# Patient Record
Sex: Female | Born: 1940 | Race: White | Hispanic: No | Marital: Married | Smoking: Never smoker
Health system: Southern US, Community
[De-identification: ages and names within clinical notes are randomized; demographics above are authoritative.]

## PROBLEM LIST (undated history)

## (undated) DIAGNOSIS — M1990 Unspecified osteoarthritis, unspecified site: Secondary | ICD-10-CM

## (undated) DIAGNOSIS — E079 Disorder of thyroid, unspecified: Secondary | ICD-10-CM

## (undated) DIAGNOSIS — F32A Depression, unspecified: Secondary | ICD-10-CM

## (undated) DIAGNOSIS — M8580 Other specified disorders of bone density and structure, unspecified site: Secondary | ICD-10-CM

## (undated) DIAGNOSIS — F329 Major depressive disorder, single episode, unspecified: Secondary | ICD-10-CM

## (undated) DIAGNOSIS — T7840XA Allergy, unspecified, initial encounter: Secondary | ICD-10-CM

## (undated) DIAGNOSIS — G43909 Migraine, unspecified, not intractable, without status migrainosus: Secondary | ICD-10-CM

## (undated) DIAGNOSIS — F0390 Unspecified dementia without behavioral disturbance: Secondary | ICD-10-CM

## (undated) DIAGNOSIS — E785 Hyperlipidemia, unspecified: Secondary | ICD-10-CM

## (undated) HISTORY — DX: Disorder of thyroid, unspecified: E07.9

## (undated) HISTORY — DX: Unspecified osteoarthritis, unspecified site: M19.90

## (undated) HISTORY — DX: Depression, unspecified: F32.A

## (undated) HISTORY — DX: Allergy, unspecified, initial encounter: T78.40XA

## (undated) HISTORY — DX: Migraine, unspecified, not intractable, without status migrainosus: G43.909

## (undated) HISTORY — DX: Major depressive disorder, single episode, unspecified: F32.9

## (undated) HISTORY — DX: Hyperlipidemia, unspecified: E78.5

## (undated) HISTORY — DX: Other specified disorders of bone density and structure, unspecified site: M85.80

## (undated) HISTORY — PX: BREAST SURGERY: SHX581

## (undated) HISTORY — DX: Unspecified dementia, unspecified severity, without behavioral disturbance, psychotic disturbance, mood disturbance, and anxiety: F03.90

---

## 1993-12-03 HISTORY — PX: COLON SURGERY: SHX602

## 2000-02-01 ENCOUNTER — Other Ambulatory Visit: Admission: RE | Admit: 2000-02-01 | Discharge: 2000-02-01 | Payer: Self-pay | Admitting: Gynecology

## 2000-02-19 ENCOUNTER — Ambulatory Visit (HOSPITAL_COMMUNITY): Admission: RE | Admit: 2000-02-19 | Discharge: 2000-02-19 | Payer: Self-pay | Admitting: Gastroenterology

## 2001-02-03 ENCOUNTER — Other Ambulatory Visit: Admission: RE | Admit: 2001-02-03 | Discharge: 2001-02-03 | Payer: Self-pay | Admitting: Gynecology

## 2002-01-05 ENCOUNTER — Other Ambulatory Visit: Admission: RE | Admit: 2002-01-05 | Discharge: 2002-01-05 | Payer: Self-pay | Admitting: Gynecology

## 2002-03-25 ENCOUNTER — Other Ambulatory Visit: Admission: RE | Admit: 2002-03-25 | Discharge: 2002-03-25 | Payer: Self-pay | Admitting: General Surgery

## 2002-04-03 ENCOUNTER — Ambulatory Visit (HOSPITAL_COMMUNITY): Admission: RE | Admit: 2002-04-03 | Discharge: 2002-04-03 | Payer: Self-pay | Admitting: General Surgery

## 2002-04-03 ENCOUNTER — Encounter (INDEPENDENT_AMBULATORY_CARE_PROVIDER_SITE_OTHER): Payer: Self-pay | Admitting: *Deleted

## 2003-02-16 ENCOUNTER — Other Ambulatory Visit: Admission: RE | Admit: 2003-02-16 | Discharge: 2003-02-16 | Payer: Self-pay | Admitting: Gynecology

## 2003-10-20 ENCOUNTER — Encounter: Admission: RE | Admit: 2003-10-20 | Discharge: 2003-10-20 | Payer: Self-pay | Admitting: Family Medicine

## 2004-03-14 ENCOUNTER — Other Ambulatory Visit: Admission: RE | Admit: 2004-03-14 | Discharge: 2004-03-14 | Payer: Self-pay | Admitting: Gynecology

## 2005-03-05 ENCOUNTER — Other Ambulatory Visit: Admission: RE | Admit: 2005-03-05 | Discharge: 2005-03-05 | Payer: Self-pay | Admitting: Gynecology

## 2005-05-10 ENCOUNTER — Inpatient Hospital Stay (HOSPITAL_COMMUNITY): Admission: EM | Admit: 2005-05-10 | Discharge: 2005-05-12 | Payer: Self-pay | Admitting: *Deleted

## 2005-05-11 ENCOUNTER — Encounter: Payer: Self-pay | Admitting: Cardiovascular Disease

## 2005-05-11 ENCOUNTER — Ambulatory Visit: Payer: Self-pay | Admitting: Cardiovascular Disease

## 2005-07-02 ENCOUNTER — Encounter: Admission: RE | Admit: 2005-07-02 | Discharge: 2005-09-30 | Payer: Self-pay | Admitting: Family Medicine

## 2006-03-11 ENCOUNTER — Other Ambulatory Visit: Admission: RE | Admit: 2006-03-11 | Discharge: 2006-03-11 | Payer: Self-pay | Admitting: Gynecology

## 2007-04-21 ENCOUNTER — Other Ambulatory Visit: Admission: RE | Admit: 2007-04-21 | Discharge: 2007-04-21 | Payer: Self-pay | Admitting: Gynecology

## 2007-12-22 LAB — HM COLONOSCOPY: HM Colonoscopy: NORMAL

## 2008-05-07 ENCOUNTER — Other Ambulatory Visit: Admission: RE | Admit: 2008-05-07 | Discharge: 2008-05-07 | Payer: Self-pay | Admitting: Gynecology

## 2009-10-04 ENCOUNTER — Encounter: Admission: RE | Admit: 2009-10-04 | Discharge: 2009-10-04 | Payer: Self-pay | Admitting: Gastroenterology

## 2010-12-24 ENCOUNTER — Encounter: Payer: Self-pay | Admitting: Neurology

## 2011-02-13 LAB — HM MAMMOGRAPHY

## 2011-02-22 LAB — HM DEXA SCAN: HM Dexa Scan: BORDERLINE

## 2011-04-20 NOTE — Discharge Summary (Signed)
NAME:  Sophia Wright, Sophia Wright                 ACCOUNT NO.:  [id number]   MEDICAL RECORD NO.:  [id number]          PATIENT TYPE:  INP   LOCATION:  4706                         FACILITY:  [facility]   PHYSICIAN:  Deirdre Peer. Polite, M.D. DATE OF BIRTH:  Jul 08, 1941   DATE OF ADMISSION:  05/10/2005  DATE OF DISCHARGE:  05/12/2005                                 DISCHARGE SUMMARY   DISCHARGE DIAGNOSES:  1.  Syncope probably on a basis of vasovagal.  2.  History of migraines with increasing frequency on an outpatient basis      resolved with current outpatient medications and further outpatient      follow-up with primary doctor.  3.  History of vaginal spotting seen on an outpatient basis by gynecologist.      Hemoglobin and hematocrit stable during this hospitalization. Recommend      continued follow up as outlined by gynecologist.  4.  History of benign positional vertigo.  5.  Anxiety/depression.  6.  Hypothyroidism. Please note, thyroid-stimulating hormones within normal      limits at 1.1.  7.  History of cholelithiasis approximately 11 years ago, status post      chemotherapy.   DISCHARGE MEDICATIONS:  The patient is to resume home medications which  include Lexapro, Synthroid, p.r.n. Zomig, p.r.n. Xanax.   DISPOSITION:  The patient is discharged to home in stable condition. Asked  to follow up with primary doctor on Monday for further outpatient follow-up.   LABORATORY DATA:  The patient had orthostatic vitals which were within  normal limits. EKG within normal limits. Spiral CT to rule out PE within  normal limits. No PE. 2-D echocardiogram with normal EF, no wall motion  abnormality. Carotid Dopplers showed no evidence of stenosis. A CT of the  brain showed no acute intracranial abnormalities. Small focus of  hypoattenuation in the left frontal deep white mater. MRI and MRA showed  mild atrophy with small vessel disease. No focal acute intracranial  abnormality which might explain the  patient's syncope. MRA negative  angiography of intracranial circulation. There are signs of chronic  sinusitis effecting mainly the ethmoid's.   CBC within normal limits. D-dimer 0.071. BMET within normal limits.   HISTORY OF PRESENT ILLNESS:  Ms. Gudgel is a 70 year old female with the  above-medical problems who presented to the ED for evaluation and syncope  spell. Per the patient's recollection, the patient was doing fine that day  without any problems. While at a grocery store standing in line, she stated  that she began to feel weak, woozy, as if she was going to fall out. The  patient subsequently had a syncope spell. Upon awakening, the patient  gradually began to feel better and initially did not want to go to the ED;  however, she was encouraged by bystanders and then went to the ED. In the  ED, the patient was evaluated and admission was deemed necessary for further  evaluation and treatment of syncope. Please see dictated H&P for further  details.   PAST MEDICAL HISTORY:  As stated above.   MEDICATIONS:  Per  admission H&P.   SOCIAL HISTORY:  Negative for tobacco, alcohol, and drug use.   HOSPITAL COURSE:  The patient was admitted to telemetry floor bed for  evaluation of syncope. As stated, the patient was in her usual state of  health until the day of admission when she had a syncopal spell. The patient  described feeling weak and lightheaded. Before her syncopal spell, she  denied any chest pain or shortness of breath. No nausea or vomiting. Did not  have any precluding migraine either, although the patient did state that she  has been having some increased frequency of her migraines recently. The  patient denied any seizure-like activity and has no history of the same. The  patient denies any new medication use. Denies having prior hypoglycemic  events. Denies having any prior syncopal events. Only thing that is somewhat  new is the patient states she has been on  Climara but she stopped it nine  days ago after having some vaginal spotting which has been and is currently  being evaluated by her OB/GYN.   During this hospitalization, the patient had several studies. The patient  was placed on a monitor. The patient had rare PVCs. As a matter of fact, the  patient had a 2-D echocardiogram that was within normal limits. Outpatient  records were checked and recently had a stress test which was within normal  limits. The patient had a MRI without acute abnormality and MRA without  acute abnormality. During this hospitalization, carotid Dopplers were done  which were within normal limits as well. All electrolytes and thyroid  studies were within normal limits. The patient occasionally had some trouble  with her migraine headache which was resolved with her p.r.n. medications.   On the second day of admission, the patient was able to ambulate in the hall  without any difficulty. No complaints of syncope. No arrhythmia. At this  time, the patient was felt stable for discharge to home. The current  diagnosis as the cause for her syncope is now vasovagal. We will recommend  the patient to be discharged to home. Have the patient follow up with  primary doctor. If the patient has further events, recommend further  evaluation at that time.   At this time, the patient is felt to be stable for discharge to home.       RDP/MEDQ  D:  05/12/2005  T:  05/12/2005  Job:  [id number]

## 2011-04-20 NOTE — H&P (Signed)
NAME:  Sophia Wright, Sophia Wright NO.:  [id number]   MEDICAL RECORD NO.:  [id number]          PATIENT TYPE:  EMS   LOCATION:  MAJO                         FACILITY:  [facility]   PHYSICIAN:  Hollice Espy, M.D.DATE OF BIRTH:  1941-03-17   DATE OF ADMISSION:  05/10/2005  DATE OF DISCHARGE:                                HISTORY & PHYSICAL   ATTENDING PHYSICIAN:  Deirdre Peer. Polite, M.D.   CHIEF COMPLAINT:  Syncope.   The patient is a 70 year old white female with past medical history of  hypothyroidism and depression who presents with near syncopal episode. She  tells me that she has been doing relatively well with no complaints other  than she has had a ten-year history of migraines and in the last few months  has been having worsening episodes. These are usually relieved with Zomig,  but she tells me that she has been under a lot of stress due to a lot of  social commitments. She had never had a syncopal episode prior to this, but  approximately 8 or 9 years ago suffered from benign positional vertigo.  Today, she said she felt in her normal state of health and was in the  grocery store talking to a friend when all of a sudden she said she began to  feel extremely lightheaded and dizzy.  She felt as if she was going to lose  her balance and felt as if things started to spin around and all of a sudden  the next thing she new she woke up and she was on the ground. Reportedly she  had passed out, fallen, striking the shelves at the grocery store, and then  falling backwards hitting the head of her head on the concrete floor. When  she woke up people were staring over her and paramedics were called. The  patient initially did not want to go to the hospital, preferred to home with  her husband, but when parents arrived she was convince to go to the  emergency room. In the emergency room, the patient underwent a CT scan of  the head which showed a small focus of hypoattenuation in  the left frontal  deep white matter compatible with microvascular ischemic change. He said he  was otherwise negative. The patient had no evidence of any subdural bleed or  any other area reported of infarct. The rest of her labs were sent off for  further evaluation including I-STAT labs which were essentially  unremarkable. EKG showed a complete normal sinus rhythm. Currently, the  patient is fully alert and oriented. She complains of some tingling in the  back of her head, but denies any vision changes or dysphagia. When I sat her  up she denied any spinning, but she felt slightly lightheaded when standing  up. She denies any dysphagia, chest pain, palpitations, shortness of breath,  wheezing, coughing, abdominal pain, hematuria, dysuria, constipation,  diarrhea, focal extremity numbness, or weakness. She does, however, tell me  she has been feeling weaker, her skin has been more dry, and she has been  wondering lately if  her thyroid is not properly dosed.   Her review of systems is otherwise negative.   PAST MEDICAL HISTORY:  1.  Colon cancer approximately 11 years ago. She is status post chemotherapy      treatment for this.  2.  Status post excision of a benign breast mass.  3.  History of benign positional vertigo approximately 10 years ago.  4.  Anxiety, depression.  5.  Hypothyroidism.  6.  Migraines which have been ongoing for approximately 25 years, usually      about two a year, but for the last two months she has had more often.   MEDICATIONS:  The patient is on Lexapro, Synthroid which she believes is 125  mg p.o. daily, p.r.n. Xanax, Zomig p.r.n.   ALLERGIES:  She has no known drug allergies.   SOCIAL HISTORY:  She denies any tobacco, alcohol, or drug use. She lives  with her husband.   FAMILY HISTORY:  Noncontributory.   PHYSICAL EXAMINATION:  VITAL SIGNS: On admission temperature 98.3, heart  rate 77, blood pressure 151/85, respirations 20, O2 saturation 98% on  room  air.  GENERAL: The patient is alert and oriented times three, in no apparent  distress.  HEENT: Normocephalic. She has evidence of a small scratch on her face and a  tender large bruise on the back of her head. Mucous membranes are slightly  dry.  NECK: She has no carotid bruits.  HEART: Regular rate and rhythm. S1 and S2.  LUNGS: Clear to auscultation bilaterally.  ABDOMEN: Soft, nontender, nondistended. Positive bowel sounds.  EXTREMITIES: No clubbing, cyanosis, or trace edema.   LABWORK:  Sodium 141, potassium 3.6, chloride 110, bicarbonate 21, BUN 15,  creatinine 1.1, glucose 99. CPK 58.9, MB less 1, and troponin-I less than  0.05.   ASSESSMENT/PLAN:  1.  Syncope of unknown etiology. The patient does have a history of      hypothyroidism and lately has been feeling more weak. Will go ahead and      check a TSH. Will also rule out an infectious cause and check a CBC as      well. The patient appears to be slightly dehydrated. Will treat her with      intravenous fluids.  2.  Vague finding on CT of the head. Will follow up with MRI of the head.  3.  Migraines,  p.r.n. Zomig.  4.  Hypothyroidism. Check a TSH. Continue Synthroid.  5.  Depression. Continue Lexapro.  6.  Anxiety. Continue p.r.n. Xanax.       SKK/MEDQ  D:  05/10/2005  T:  05/10/2005  Job:  [id number]   cc:   Chales Salmon. Abigail Miyamoto, M.D.  [address]  [place]  [place] [address]  Fax: [phone]

## 2011-04-20 NOTE — Op Note (Signed)
[facility]  Patient:    Sophia Wright, Sophia Wright Visit Number: [id number] MRN: [id number]          Service Type: DSU Location: DAY Attending Physician:  Tempie Donning Dictated by:   Gita Kudo, M.D. Proc. Date: 04/03/02 Admit Date:  04/03/2002   CC:         Leatha Gilding. Mezer, M.D.   Operative Report  OPERATIVE PROCEDURE:  Excision right breast mass.  SURGEON:  Gita Kudo, M.D.  ANESTHESIA:  MAC - 1% Xylocaine, IV sedation.  CLINICAL SUMMARY:  A 70 year old female with benign left breast biopsy many years ago.  Has a thickened area in the right breast.  Mammograms initially abnormal, but repeats showed no definite abnormality.  Physical examination reveals a firmness in the right breast at about 8 oclock.  Needle aspiration consistent with fibroadenoma but clinical correlation requested.  OPERATIVE FINDINGS:  The right breast tissue had several dilated ducts.  No large cyst was encountered.  The tissue appeared normal, although somewhat firm and rubbery.  OPERATIVE PROCEDURE:  Under satisfactory intravenous sedation, the patients right chest and breast were prepped and draped in a standard fashion.  A radial incision made at 9 oclock with small breast flaps developed of the skin.  Then the breast tissue was grasped with an Allis clamp, and a large representative sample incorporating the thickened abnormality was removed. The wound was made dry by cautery, lavaged with saline, closed in layers with 3-0 Vicryl and 4-0 nylon for skin.  Sterile absorbent dressing applied.  To the recovery room in good condition with no complications and normal sponge and needle count. Dictated by:   Gita Kudo, M.D. Attending Physician:  Tempie Donning DD:  04/03/02 TD:  04/04/02 Job: [id number]

## 2011-06-12 ENCOUNTER — Ambulatory Visit: Payer: Self-pay | Admitting: Internal Medicine

## 2011-06-21 ENCOUNTER — Ambulatory Visit (INDEPENDENT_AMBULATORY_CARE_PROVIDER_SITE_OTHER): Payer: Medicare Other | Admitting: Family Medicine

## 2011-06-21 ENCOUNTER — Encounter: Payer: Self-pay | Admitting: Family Medicine

## 2011-06-21 DIAGNOSIS — H409 Unspecified glaucoma: Secondary | ICD-10-CM

## 2011-06-21 DIAGNOSIS — Z8659 Personal history of other mental and behavioral disorders: Secondary | ICD-10-CM | POA: Insufficient documentation

## 2011-06-21 DIAGNOSIS — G43909 Migraine, unspecified, not intractable, without status migrainosus: Secondary | ICD-10-CM

## 2011-06-21 DIAGNOSIS — M1990 Unspecified osteoarthritis, unspecified site: Secondary | ICD-10-CM

## 2011-06-21 DIAGNOSIS — Z85038 Personal history of other malignant neoplasm of large intestine: Secondary | ICD-10-CM | POA: Insufficient documentation

## 2011-06-21 DIAGNOSIS — E785 Hyperlipidemia, unspecified: Secondary | ICD-10-CM | POA: Insufficient documentation

## 2011-06-21 DIAGNOSIS — E039 Hypothyroidism, unspecified: Secondary | ICD-10-CM

## 2011-06-21 NOTE — Patient Instructions (Addendum)
Consider home blood pressure monitor. Goal is blood pressure less than 140/90 Consider counseling related to your depression Discussed thyroid medication with Dr. Arnetha Massy plan on scheduling followup in about 3 weeks to reassess

## 2011-06-21 NOTE — Progress Notes (Signed)
Subjective:    Patient ID: Sophia Wright, female    DOB: 28-Nov-1941, 70 y.o.   MRN: [id number]  HPI Patient to establish care. No old records for review at this time. Other problems include history of osteoarthritis, past history of depression, glaucoma, questionable hypertension, hyperlipidemia, migraine headaches, hypothyroidism. Colon cancer diagnosed around 1995. Had partial colectomy. Surgical history previous breast biopsy which was benign and tonsillectomy.  Medications include armour thyroid, metoprolol 25 mg one half tablet 2 times daily, simvastatin 20 mg daily and glaucoma eye drop. She reportedly had pruritus with synthetic thyroid hormone and now has pruritus which she attributes to her current thyroid medication. She is followed by endocrinologist. She has not discussed with him at this time. Itching not associated with rash.  No history of liver problem  She is concerned about possible memory issues. Evaluated at [facility] couple years ago. Question of short-term memory loss. Not clear extent of workup previously. She is not sure if B12 levels have been checked.  Previously treated for depression. Loss of sister in January of this year and still grieving. Question of memory loss related to previous antidepressant medication.  Patient takes simvastatin for her lipids and is concerned this could be contributing to memory as well. Frequent crying spells. No suicidal ideation. She would like to consider counseling.  Past Medical History  Diagnosis Date  . Arthritis   . Depression   . Glaucoma   . Allergy   . Thyroid disease   . Migraines   . Hyperlipidemia    Past Surgical History  Procedure Date  . Colon surgery 1995    cancer    reports that she has never smoked. She does not have any smokeless tobacco history on file. Her alcohol and drug histories not on file. family history includes Alcohol abuse in her father and Cancer in her paternal grandmother. No Known  Allergies      Review of Systems  Constitutional: Negative for fever, chills, activity change, appetite change, fatigue and unexpected weight change.  HENT: Negative for ear pain, sore throat and trouble swallowing.   Eyes: Negative for visual disturbance.  Respiratory: Negative for cough, shortness of breath and wheezing.   Cardiovascular: Negative for chest pain, palpitations and leg swelling.  Gastrointestinal: Negative for abdominal pain, blood in stool and abdominal distention.  Genitourinary: Negative for dysuria, hematuria and flank pain.  Musculoskeletal: Positive for arthralgias. Negative for myalgias, joint swelling and gait problem.  Skin: Negative for rash.  Neurological: Negative for dizziness, tremors, seizures, syncope, weakness and headaches.  Hematological: Negative for adenopathy. Does not bruise/bleed easily.  Psychiatric/Behavioral: Positive for dysphoric mood. Negative for suicidal ideas, confusion and agitation.       Objective:   Physical Exam  Constitutional: She is oriented to person, place, and time. She appears well-developed and well-nourished. No distress.  HENT:  Right Ear: External ear normal.  Left Ear: External ear normal.  Mouth/Throat: Oropharynx is clear and moist.  Eyes: Pupils are equal, round, and reactive to light.  Neck: Neck supple. No thyromegaly present.  Cardiovascular: Normal rate and regular rhythm.  Exam reveals no gallop.   Pulmonary/Chest: Effort normal and breath sounds normal. No respiratory distress. She has no wheezes. She has no rales.  Musculoskeletal: She exhibits no edema.  Lymphadenopathy:    She has no cervical adenopathy.  Neurological: She is alert and oriented to person, place, and time. No cranial nerve deficit.  Skin: No rash noted.  Psychiatric: She has a  normal mood and affect. Her behavior is normal.          Assessment & Plan:  #1 hypothyroidism. Followed by endocrinologist #2 recent pruritus  questionably related to thyroid medication. Encouraged to discuss with endocrinologist. She has no evidence for rash today. #3 hyperlipidemia. Send for old records #4 glaucoma followed by ophthalmologist #5 history of migraine headaches #6 elevated blood pressure. Encouraged to get a home monitor #7 history of depression. Grief reaction from recent loss of sister. Name details was given. May need to consider reinitiating antidepressant and reassess in 3 weeks #8 osteoarthritis involving multiple joints. #9 question of short-term memory loss. Central records. Followup 3 weeks. Mini-Mental status exam then and had further labs such as B12 if not recently checked

## 2011-07-03 ENCOUNTER — Encounter: Payer: Self-pay | Admitting: Family Medicine

## 2011-07-12 ENCOUNTER — Ambulatory Visit: Payer: Medicare Other | Admitting: Family Medicine

## 2011-07-18 ENCOUNTER — Encounter: Payer: Self-pay | Admitting: Family Medicine

## 2011-07-18 ENCOUNTER — Ambulatory Visit (INDEPENDENT_AMBULATORY_CARE_PROVIDER_SITE_OTHER): Payer: Medicare Other | Admitting: Family Medicine

## 2011-07-18 DIAGNOSIS — R413 Other amnesia: Secondary | ICD-10-CM

## 2011-07-18 DIAGNOSIS — F329 Major depressive disorder, single episode, unspecified: Secondary | ICD-10-CM

## 2011-07-18 DIAGNOSIS — E785 Hyperlipidemia, unspecified: Secondary | ICD-10-CM

## 2011-07-18 DIAGNOSIS — E039 Hypothyroidism, unspecified: Secondary | ICD-10-CM

## 2011-07-18 LAB — LIPID PANEL
Cholesterol: 156 mg/dL (ref 0–200)
HDL: 61.3 mg/dL (ref >39.00)
LDL Cholesterol: 68 mg/dL (ref 0–99)
Total CHOL/HDL Ratio: 3
Triglycerides: 136 mg/dL (ref 0.0–149.0)
VLDL: 27.2 mg/dL (ref 0.0–40.0)

## 2011-07-18 LAB — HEPATIC FUNCTION PANEL
ALT: 17 U/L (ref 0–35)
AST: 27 U/L (ref 0–37)
Albumin: 4.6 g/dL (ref 3.5–5.2)
Alkaline Phosphatase: 67 U/L (ref 39–117)
Bilirubin, Direct: 0.1 mg/dL (ref 0.0–0.3)
Total Bilirubin: 0.5 mg/dL (ref 0.3–1.2)
Total Protein: 7 g/dL (ref 6.0–8.3)

## 2011-07-18 LAB — VITAMIN B12: Vitamin B-12: 471 pg/mL (ref 211–911)

## 2011-07-18 MED ORDER — SERTRALINE HCL 50 MG PO TABS: 50.0000 mg | ORAL_TABLET | Freq: Every day | ORAL | Status: DC

## 2011-07-18 MED ORDER — SIMVASTATIN 20 MG PO TABS: 20.0000 mg | ORAL_TABLET | Freq: Every day | ORAL | Status: DC

## 2011-07-18 MED ORDER — LEVOTHYROXINE SODIUM 75 MCG PO TABS: 75.0000 ug | ORAL_TABLET | Freq: Every day | ORAL | Status: DC

## 2011-07-18 MED ORDER — METOPROLOL TARTRATE 25 MG PO TABS: 25.0000 mg | ORAL_TABLET | Freq: Two times a day (BID) | ORAL | Status: DC

## 2011-07-18 NOTE — Progress Notes (Signed)
  Subjective:    Patient ID: Sophia Wright, female    DOB: [birth year], 70 y.o.   MRN: [id number]  HPI Medical followup. Hypothyroidism, osteoarthritis, history of migraine headaches, hyperlipidemia, and history of depression.  Recently dealing with depression with loss of sister. She did start counseling which is helping some. She feels overall that her depression is not greatly improved.  Would like to consider antidepressant. Has been treated previously with antidepressants. She recalls taking Zoloft without side effects. No suicidal ideation. Sleep is good.  Hypothyroidism. Reportedly had TSH recently about a month ago normal. She would like for [place] to take over her refills and monitoring of that. Previously seen endocrinologist.  Hyperlipidemia. Several months since previous lipid panel. Takes Zocor 20 mg daily. Needs refills.  Concern for recent memory deficits. She is dealing with depression. No recent B12   Review of Systems  Constitutional: Positive for fatigue. Negative for fever, chills, activity change and appetite change.  Respiratory: Negative for cough and shortness of breath.   Cardiovascular: Negative for chest pain.  Gastrointestinal: Negative for abdominal pain.  Genitourinary: Negative for dysuria.  Neurological: Negative for dizziness, syncope, weakness and headaches.  Psychiatric/Behavioral: Positive for dysphoric mood. Negative for suicidal ideas and sleep disturbance.       Objective:   Physical Exam  Constitutional: She is oriented to person, place, and time. She appears well-developed and well-nourished. No distress.  Neck: Neck supple. No thyromegaly present.  Cardiovascular: Normal rate and regular rhythm.   Pulmonary/Chest: Breath sounds normal. No respiratory distress. She has no wheezes. She has no rales.  Musculoskeletal: She exhibits no edema.  Lymphadenopathy:    She has no cervical adenopathy.  Neurological: She is alert and oriented to person,  place, and time. No cranial nerve deficit.  Psychiatric: Her behavior is normal. Judgment and thought content normal.          Assessment & Plan:  #1 Maj. depressive episode. Initiate sertraline 50 mg daily. Continue counseling. Reassess one month #2 concern for possible memory deficit. Possibly related to #1. Check B12. Mini-Mental Status at followup in one month-after depression stabilized. #3 hyperlipidemia. Check lipid and hepatic panel #4 hypothyroidism. Refilled thyroid medication. Recheck within one year

## 2011-07-19 NOTE — Progress Notes (Signed)
Quick Note:  Pt informed ______ 

## 2011-08-15 ENCOUNTER — Ambulatory Visit (INDEPENDENT_AMBULATORY_CARE_PROVIDER_SITE_OTHER): Payer: Medicare Other | Admitting: Family Medicine

## 2011-08-15 ENCOUNTER — Encounter: Payer: Self-pay | Admitting: Family Medicine

## 2011-08-15 VITALS — BP 130/72 | Temp 97.8°F | Wt 170.0 lb

## 2011-08-15 DIAGNOSIS — G3184 Mild cognitive impairment, so stated: Secondary | ICD-10-CM

## 2011-08-15 DIAGNOSIS — F329 Major depressive disorder, single episode, unspecified: Secondary | ICD-10-CM

## 2011-08-15 DIAGNOSIS — Z23 Encounter for immunization: Secondary | ICD-10-CM

## 2011-08-15 MED ORDER — FLUOXETINE HCL 20 MG PO CAPS: 20.0000 mg | ORAL_CAPSULE | Freq: Every day | ORAL | Status: DC

## 2011-08-15 NOTE — Progress Notes (Signed)
  Subjective:    Patient ID: Sophia Wright, female    DOB: 09/01/1941, 70 y.o.   MRN: [id number]  HPI Patient seen for followup. Refer to prior note.  Concerns for depression related to loss of sister. We started sertraline and she's had some headaches consistently which went away after she stopped this. She is seeking counseling which is helping. She has depressed mood but no suicidal ideation. Frequent crying spells. Decreased motivation. She would like to try another antidepressant at this time. History of hypothyroidism which is being treated.  Patient has concerns regarding some cognitive impairment over several months. Recent B12 normal. We did discuss getting baseline mental status exam today. She does try to engage in some things like puzzles   Review of Systems  Constitutional: Negative for chills, appetite change and unexpected weight change.  Respiratory: Negative for shortness of breath.   Cardiovascular: Negative for chest pain, palpitations and leg swelling.  Genitourinary: Negative for dysuria.  Neurological: Negative for dizziness and headaches.  Psychiatric/Behavioral: Positive for dysphoric mood.       Objective:   Physical Exam  Constitutional: She is oriented to person, place, and time. She appears well-developed and well-nourished.  Cardiovascular: Normal rate, regular rhythm and normal heart sounds.   Pulmonary/Chest: Effort normal and breath sounds normal. No respiratory distress. She has no wheezes. She has no rales.  Musculoskeletal: She exhibits no edema.  Neurological: She is alert and oriented to person, place, and time.  Psychiatric: She has a normal mood and affect. Her behavior is normal.       Mini mental status exam 25/30          Assessment & Plan:  #1 depression. Possible intolerance to sertraline. Start fluoxetine 20 mg daily and reassess one month. Continue counseling #2 mild cognitive impairment. Mental status exam 25/30. At this point we have  elected not to starting medications such as Namenda or Aricept but if she does have issues after depression fully treated may consider medications to help.

## 2011-09-10 ENCOUNTER — Telehealth: Payer: Self-pay | Admitting: Family Medicine

## 2011-09-10 NOTE — Telephone Encounter (Signed)
Pt is coming in for ov to see pcp on 10/10 and pt wanted to make Dr Amador Cunas aware that Fluoxentine is giving pt headaches, so pt has discontinued med.

## 2011-09-10 NOTE — Telephone Encounter (Signed)
I do not see where pt saw Dr Kirtland Bouchard?  Please advise

## 2011-09-11 MED ORDER — DESVENLAFAXINE SUCCINATE ER 50 MG PO TB24: 50.0000 mg | ORAL_TABLET | Freq: Every day | ORAL | Status: DC

## 2011-09-11 NOTE — Telephone Encounter (Signed)
Pt informed, she had OV scheduled for tomorrow to F/U on meds, she will postpone a few weeks with new med and schedule OV in 3 weeks

## 2011-09-11 NOTE — Telephone Encounter (Signed)
Pt has had headaches now with 2 different meds-Zoloft and Prozac.  Let's try Pristiq 50 mg daily and have pt follow up in 3 weeks for follow up.

## 2011-09-12 ENCOUNTER — Ambulatory Visit: Payer: Medicare Other | Admitting: Family Medicine

## 2011-09-19 ENCOUNTER — Encounter: Payer: Self-pay | Admitting: Family Medicine

## 2011-09-26 ENCOUNTER — Ambulatory Visit: Payer: Medicare Other | Admitting: Family Medicine

## 2011-10-01 ENCOUNTER — Encounter: Payer: Self-pay | Admitting: Family Medicine

## 2011-10-01 ENCOUNTER — Ambulatory Visit (INDEPENDENT_AMBULATORY_CARE_PROVIDER_SITE_OTHER): Payer: Medicare Other | Admitting: Family Medicine

## 2011-10-01 VITALS — BP 130/72 | Temp 98.0°F | Wt 170.0 lb

## 2011-10-01 DIAGNOSIS — F329 Major depressive disorder, single episode, unspecified: Secondary | ICD-10-CM

## 2011-10-01 NOTE — Progress Notes (Signed)
  Subjective:    Patient ID: Sophia Wright, female    DOB: [birth year], 70 y.o.   MRN: [id number]  HPI  Medical followup. Refer to prior note. Concerns for mild memory loss and depression. Recent loss of sister.  She had intolerance to fluoxetine and sertraline. Had headaches with those and headaches went away promptly after stopping both medications. She has a prescription for Pristiq but has not yet started. She has recently been spending time with grandchildren and feels her mood has improved somewhat. Fewer crying spells. No suicidal ideation.   Review of Systems  Respiratory: Negative for cough and shortness of breath.   Cardiovascular: Negative for chest pain.  Psychiatric/Behavioral: Positive for dysphoric mood. Negative for suicidal ideas and self-injury.       Objective:   Physical Exam  Constitutional: She is oriented to person, place, and time. She appears well-developed and well-nourished. No distress.  Cardiovascular: Normal rate and regular rhythm.   Pulmonary/Chest: Effort normal and breath sounds normal. No respiratory distress. She has no wheezes. She has no rales.  Musculoskeletal: She exhibits no edema.  Neurological: She is alert and oriented to person, place, and time.  Psychiatric: She has a normal mood and affect. Her behavior is normal.          Assessment & Plan:  Depression. Start medication as above. We've discussed counseling and other strategies to help reduce her symptoms. Touch base by phone in one month for feedback after starting medication. Reassess in 6 months. Consider repeat Mini-Mental status exam then

## 2011-10-01 NOTE — Patient Instructions (Signed)
Give me some feedback in one month regarding response to the Pristiq

## 2011-10-29 ENCOUNTER — Other Ambulatory Visit: Payer: Self-pay | Admitting: Family Medicine

## 2011-10-29 NOTE — Telephone Encounter (Signed)
Refill for 6 months. 

## 2011-10-29 NOTE — Telephone Encounter (Signed)
Pls advise.  

## 2011-10-29 NOTE — Telephone Encounter (Signed)
Pt called and said that desvenlafaxine (PRISTIQ) 50 MG 24 hr tablet is working really well and pt is req to get a refill called in to [facility] on [address]. Pt says that she only has 2 pills remaining.

## 2011-10-30 MED ORDER — DESVENLAFAXINE SUCCINATE ER 50 MG PO TB24: 50.0000 mg | ORAL_TABLET | Freq: Every day | ORAL | Status: DC

## 2011-10-30 NOTE — Telephone Encounter (Signed)
rx sent to pharmacy

## 2011-12-21 ENCOUNTER — Ambulatory Visit: Payer: Medicare Other | Admitting: Family Medicine

## 2011-12-28 ENCOUNTER — Ambulatory Visit: Payer: Medicare Other | Admitting: Family Medicine

## 2012-01-04 ENCOUNTER — Ambulatory Visit (INDEPENDENT_AMBULATORY_CARE_PROVIDER_SITE_OTHER): Payer: Medicare Other | Admitting: Family Medicine

## 2012-01-04 ENCOUNTER — Encounter: Payer: Self-pay | Admitting: Family Medicine

## 2012-01-04 DIAGNOSIS — E039 Hypothyroidism, unspecified: Secondary | ICD-10-CM

## 2012-01-04 DIAGNOSIS — Z8659 Personal history of other mental and behavioral disorders: Secondary | ICD-10-CM

## 2012-01-04 LAB — TSH: TSH: 1.88 u[IU]/mL (ref 0.35–5.50)

## 2012-01-04 MED ORDER — ARIPIPRAZOLE 2 MG PO TABS: 2.0000 mg | ORAL_TABLET | Freq: Every day | ORAL | Status: DC

## 2012-01-04 NOTE — Progress Notes (Signed)
  Subjective:    Patient ID: Sophia Wright, female    DOB: 06-27-1941, 71 y.o.   MRN: [id number]  HPI  Patient seen for follow up depression and fatigue issues.  Lost sister during the past year. Great difficulty coping. She's had counseling. We tried initially fluoxetine and sertraline and she had headaches. She is currently taking Pristiq and is having ongoing depression issues. Low motivation. Frequent crying spells. Low energy. No initiative. She has hypothyroidism and had thyroid functions reportedly last July which were normal. She is compliant with taking thyroid meds. No suicidal ideation.  Past Medical History  Diagnosis Date  . Arthritis   . Depression   . Glaucoma   . Allergy   . Thyroid disease   . Migraines   . Hyperlipidemia    Past Surgical History  Procedure Date  . Colon surgery 1995    cancer    reports that she has never smoked. She does not have any smokeless tobacco history on file. Her alcohol and drug histories not on file. family history includes Alcohol abuse in her father and Cancer in her paternal grandmother. No Known Allergies    Review of Systems  Constitutional: Positive for fatigue. Negative for appetite change and unexpected weight change.  Respiratory: Negative for cough and shortness of breath.   Cardiovascular: Negative for chest pain.  Psychiatric/Behavioral: Positive for dysphoric mood. Negative for suicidal ideas and confusion. The patient is nervous/anxious.        Objective:   Physical Exam  Constitutional: She is oriented to person, place, and time. She appears well-developed and well-nourished.  Neck: Neck supple. No thyromegaly present.  Cardiovascular: Normal rate and regular rhythm.   Pulmonary/Chest: Effort normal and breath sounds normal. No respiratory distress. She has no wheezes. She has no rales.  Neurological: She is alert and oriented to person, place, and time.  Psychiatric: Her behavior is normal. Judgment and thought  content normal.       Depressed mood          Assessment & Plan:  #1 Depression. Continue counseling. Add Abilify 2 mg each bedtime. Reassess one month.  #2 hypothyroidism. Recheck TSH

## 2012-01-07 NOTE — Progress Notes (Signed)
Quick Note:  Pt informed ______ 

## 2012-02-01 ENCOUNTER — Ambulatory Visit (INDEPENDENT_AMBULATORY_CARE_PROVIDER_SITE_OTHER): Payer: Medicare Other | Admitting: Family Medicine

## 2012-02-01 ENCOUNTER — Encounter: Payer: Self-pay | Admitting: Family Medicine

## 2012-02-01 DIAGNOSIS — G3184 Mild cognitive impairment, so stated: Secondary | ICD-10-CM

## 2012-02-01 NOTE — Progress Notes (Signed)
  Subjective:    Patient ID: Sophia Wright, female    DOB: 1941-07-23, 71 y.o.   MRN: [id number]  HPI  Followup depression. Patient has seen great improvement with addition of Abilify to Pristiq.  Overall sleep is fairly good. She is smiling more and more engaged in activities. She is still not driving.Peri [place] appetite. No suicidal ideation. She developed some itching of the scalp about 3 days ago stopped taking the Abilify but itching has persisted. No rash. She also had some itching prior to starting Abilify.  History of mild cognitive impairment with previous evaluation [facility] 2010. Mini-Mental status exam 24/30 then.  similar by recent assessment.   Review of Systems  Constitutional: Negative for fatigue.  Eyes: Negative for visual disturbance.  Respiratory: Negative for cough, chest tightness, shortness of breath and wheezing.   Cardiovascular: Negative for chest pain, palpitations and leg swelling.  Neurological: Negative for dizziness, seizures, syncope, weakness, light-headedness and headaches.  Psychiatric/Behavioral: Negative for confusion and agitation.       Objective:   Physical Exam  Constitutional: She is oriented to person, place, and time. She appears well-developed and well-nourished.  Cardiovascular: Normal rate and regular rhythm.   Pulmonary/Chest: Effort normal and breath sounds normal.  Neurological: She is alert and oriented to person, place, and time.  Psychiatric:       Animal naming test 15 animals named in 1 minute.          Assessment & Plan:  #1 depression improved. Start back Abilify in addition to her other antidepressant. Reassess in 3 months #2 history of mild cognitive impairment. Repeat Folstein mental status exam in 3 months

## 2012-02-02 DIAGNOSIS — G3184 Mild cognitive impairment, so stated: Secondary | ICD-10-CM | POA: Insufficient documentation

## 2012-03-20 ENCOUNTER — Telehealth: Payer: Self-pay | Admitting: *Deleted

## 2012-03-20 NOTE — Telephone Encounter (Signed)
Spoke with Sophia Wright and she did some screening and found some memory deficits and will be sending that information over.

## 2012-03-20 NOTE — Telephone Encounter (Signed)
Pt's Pyschotherapist, Berniece Andreas would like to speak to Dr. Caryl Never about this mutual patient.  He can reach her after 5 pm today.

## 2012-03-21 ENCOUNTER — Other Ambulatory Visit: Payer: Self-pay | Admitting: Family Medicine

## 2012-04-17 ENCOUNTER — Telehealth: Payer: Self-pay | Admitting: *Deleted

## 2012-04-17 NOTE — Telephone Encounter (Signed)
VM received from Phychotherapist, Raynelle Fanning.  Pt has a follow-up with her on 6/5.  She feels it would be helpful for Shanicka to follow-up with Dr Caryl Never before 6/5 to go over notes faxed over on 5/1.  Raynelle Fanning was questioning it Dr Caryl Never received the notes.  I can have someone call pt to schedule a follow-up here soon.

## 2012-04-17 NOTE — Telephone Encounter (Signed)
Yes. Schedule follow up

## 2012-04-18 NOTE — Telephone Encounter (Signed)
Joni Reining, please help schedule this pt and thank you.

## 2012-04-18 NOTE — Telephone Encounter (Signed)
Pt attp for 05/07/12 has been rescheduled for 04/24/12

## 2012-04-24 ENCOUNTER — Ambulatory Visit (INDEPENDENT_AMBULATORY_CARE_PROVIDER_SITE_OTHER): Payer: Medicare Other | Admitting: Family Medicine

## 2012-04-24 ENCOUNTER — Encounter: Payer: Self-pay | Admitting: Family Medicine

## 2012-04-24 VITALS — BP 140/72 | Temp 98.0°F | Wt 177.0 lb

## 2012-04-24 DIAGNOSIS — R4189 Other symptoms and signs involving cognitive functions and awareness: Secondary | ICD-10-CM

## 2012-04-24 DIAGNOSIS — F09 Unspecified mental disorder due to known physiological condition: Secondary | ICD-10-CM

## 2012-04-24 DIAGNOSIS — F0280 Dementia in other diseases classified elsewhere without behavioral disturbance: Secondary | ICD-10-CM | POA: Insufficient documentation

## 2012-04-24 DIAGNOSIS — Z8659 Personal history of other mental and behavioral disorders: Secondary | ICD-10-CM

## 2012-04-24 MED ORDER — DONEPEZIL HCL 5 MG PO TABS: 5.0000 mg | ORAL_TABLET | Freq: Every evening | ORAL | Status: DC | PRN

## 2012-04-24 NOTE — Progress Notes (Signed)
  Subjective:    Patient ID: Sophia Wright, female    DOB: December 10, 1940, 71 y.o.   MRN: [id number]  HPI  Patient seen for followup regarding some recent memory issues. She had comprehensive evaluation back in 2010 at [facility]. Possible mild cognitive impairment that time. She has recently been seen by counselor and has had extensive screening including Mini-Mental Status exam with score of 18/30 recently. Family has noted a significant decline. We initially also this may be related to depression but after treatment with combination therapy her cognitive abilities not seem to improve. She had recent normal B12 and thyroid functions. Currently feels her depression is stable. Medications are reviewed as outlined.  She tries to stay busy with reading and doing puzzles. She is frequently misplacing things. Frequently struggles to remember day of week.   Review of Systems  Constitutional: Negative for appetite change and unexpected weight change.  Respiratory: Negative for cough and shortness of breath.   Cardiovascular: Negative for chest pain.  Hematological: Negative for adenopathy.  Psychiatric/Behavioral: Positive for confusion. Negative for hallucinations and agitation. The patient is not nervous/anxious.        Objective:   Physical Exam  Constitutional: She is oriented to person, place, and time. She appears well-developed and well-nourished.  Neck: Neck supple. No thyromegaly present.  Cardiovascular: Normal rate and regular rhythm.   Pulmonary/Chest: Effort normal and breath sounds normal. No respiratory distress. She has no wheezes. She has no rales.  Musculoskeletal: She exhibits no edema.  Neurological: She is alert and oriented to person, place, and time. No cranial nerve deficit.  Psychiatric: She has a normal mood and affect. Her behavior is normal.          Assessment & Plan:  Cognitive impairment. 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 Gradual steady  decline especially over the past year. Start Aricept 5 mg daily after one month if tolerating well titrate to 10 mg. Reassess in 2 months.  Discussed other options such as Namenda and Exelon and family aware no clinical superiority of one treatment over another and medication decisions largely will be determined by tolerance to possible side effects. Depression.  Stable.  They will try leaving off her Abilify at this time but maintain Pristiq.

## 2012-04-24 NOTE — Patient Instructions (Signed)
Stop Abilify at this time Start Aricept 5 mg one at night. In one month, give me some feedback regarding the Aricept. Our goal is to increase Aricept in one month to 10 mg if tolerating well.

## 2012-05-06 ENCOUNTER — Telehealth: Payer: Self-pay

## 2012-05-06 MED ORDER — MEMANTINE HCL 5 MG PO TABS: 5.0000 mg | ORAL_TABLET | Freq: Two times a day (BID) | ORAL | Status: DC

## 2012-05-06 NOTE — Telephone Encounter (Signed)
Stop Aricept and start Namenda 5 mg twice a day and reassess one month

## 2012-05-06 NOTE — Telephone Encounter (Signed)
Pt informed

## 2012-05-06 NOTE — Telephone Encounter (Signed)
Pt recently started on Aricept, but broke out in a rash and stopped taking it.  She would like to know what you do next.

## 2012-05-07 ENCOUNTER — Ambulatory Visit: Payer: Medicare Other | Admitting: Family Medicine

## 2012-05-15 ENCOUNTER — Other Ambulatory Visit: Payer: Self-pay | Admitting: Family Medicine

## 2012-05-21 ENCOUNTER — Ambulatory Visit: Payer: Medicare Other | Admitting: Family Medicine

## 2012-06-25 ENCOUNTER — Ambulatory Visit (INDEPENDENT_AMBULATORY_CARE_PROVIDER_SITE_OTHER): Payer: Medicare Other | Admitting: Family Medicine

## 2012-06-25 ENCOUNTER — Encounter: Payer: Self-pay | Admitting: Family Medicine

## 2012-06-25 VITALS — BP 130/80 | Temp 98.1°F | Wt 173.0 lb

## 2012-06-25 DIAGNOSIS — R4189 Other symptoms and signs involving cognitive functions and awareness: Secondary | ICD-10-CM

## 2012-06-25 DIAGNOSIS — F0390 Unspecified dementia without behavioral disturbance: Secondary | ICD-10-CM

## 2012-06-25 DIAGNOSIS — F09 Unspecified mental disorder due to known physiological condition: Secondary | ICD-10-CM

## 2012-06-25 NOTE — Progress Notes (Signed)
  Subjective:    Patient ID: Sophia Wright, female    DOB: 1941/10/30, 71 y.o.   MRN: [id number]  HPI  Followup dementia. Recent Mini-Mental Status exam 18/30. We started Aricept but she had itching without clear visible rash. Her itching did improve though not totally resolved after stopping Aricept. She's had some recent hot flashes which also seemed better after stopping Aricept. We switched her over to [facility] which she has not yet started. She denied any nausea symptoms with Aricept. She has hypothyroidism which has been adequately replaced with a normal TSH back in February. No agitation.   Review of Systems  Respiratory: Negative for shortness of breath.   Cardiovascular: Negative for chest pain.  Neurological: Negative for dizziness, syncope and headaches.  Psychiatric/Behavioral: Positive for confusion.       Objective:   Physical Exam  Constitutional: She appears well-developed and well-nourished.  Neck: Neck supple. No thyromegaly present.  Cardiovascular: Normal rate and regular rhythm.   Pulmonary/Chest: Effort normal and breath sounds normal. No respiratory distress. She has no wheezes. She has no rales.  Musculoskeletal: She exhibits no edema.  Neurological: She is alert.          Assessment & Plan:  Dementia, probable Alzheimer's type. Possible adverse reaction with pruritus from Aricept. Start Namenda 5 mg daily for one week and titrate at one-week intervals up to a target dose of 10 mg twice daily. Followup labs including lipid panel and hepatic panel TSH at followup in 2 months

## 2012-06-25 NOTE — Patient Instructions (Addendum)
Start Namenda 5 mg once daily. In one week titrate to 5 mg twice daily if no adverse side effects. Continue increasing to 10 mg and 5 mg in one week and then 10 mg twice daily by the following week.

## 2012-06-26 DIAGNOSIS — F0390 Unspecified dementia without behavioral disturbance: Secondary | ICD-10-CM | POA: Insufficient documentation

## 2012-07-15 ENCOUNTER — Other Ambulatory Visit: Payer: Self-pay | Admitting: Family Medicine

## 2012-08-27 ENCOUNTER — Ambulatory Visit (INDEPENDENT_AMBULATORY_CARE_PROVIDER_SITE_OTHER): Payer: Medicare Other | Admitting: Family Medicine

## 2012-08-27 DIAGNOSIS — F0390 Unspecified dementia without behavioral disturbance: Secondary | ICD-10-CM

## 2012-08-27 DIAGNOSIS — Z23 Encounter for immunization: Secondary | ICD-10-CM

## 2012-08-27 DIAGNOSIS — E785 Hyperlipidemia, unspecified: Secondary | ICD-10-CM

## 2012-08-27 DIAGNOSIS — Z85038 Personal history of other malignant neoplasm of large intestine: Secondary | ICD-10-CM

## 2012-08-27 DIAGNOSIS — M1990 Unspecified osteoarthritis, unspecified site: Secondary | ICD-10-CM

## 2012-08-27 NOTE — Progress Notes (Signed)
  Subjective:    Patient ID: Sophia Wright, female    DOB: 16-Mar-1941, 71 y.o.   MRN: [id number]  HPI  Patient has history of hyperlipidemia, dementia, glaucoma, colon cancer, history depression, hypothyroidism, and osteoarthritis. Recently started Namenda. She was unable to increase to 10 mg twice daily because of some sweats. We explained this may not have been due to Namenda but possibly due to her Pristiq.  In any event, she is currently taking Namenda 5 mg 3 times a day. Husband has not seen much change in her cognitive function. Still remains very forgetful.  Patient was tried on Aricept but had rash.  She had previous history of colon cancer. Last colonoscopy 2008. Recommend a five-year interval at that point. No recent stool changes.  Hyperlipidemia treated with simvastatin. No myalgias. Overdue for lipids. Patient also needs repeat mammogram and DEXA scan.  Past Medical History  Diagnosis Date  . Arthritis   . Depression   . Glaucoma   . Allergy   . Thyroid disease   . Migraines   . Hyperlipidemia    Past Surgical History  Procedure Date  . Colon surgery 1995    cancer    reports that she has never smoked. She does not have any smokeless tobacco history on file. Her alcohol and drug histories not on file. family history includes Alcohol abuse in her father and Cancer in her paternal grandmother. Allergies  Allergen Reactions  . Aricept (Donepezil Hcl) Itching      Review of Systems  Constitutional: Negative for fever, chills, appetite change, fatigue and unexpected weight change.  Eyes: Negative for visual disturbance.  Respiratory: Negative for cough, chest tightness, shortness of breath and wheezing.   Cardiovascular: Negative for chest pain, palpitations and leg swelling.  Genitourinary: Negative for dysuria.  Neurological: Negative for dizziness, seizures, syncope, weakness, light-headedness and headaches.  Psychiatric/Behavioral: Negative for dysphoric mood  and agitation.       Objective:   Physical Exam  Constitutional: She is oriented to person, place, and time. She appears well-developed and well-nourished.  HENT:  Mouth/Throat: Oropharynx is clear and moist.  Neck: Neck supple. No thyromegaly present.  Cardiovascular: Normal rate and regular rhythm.   Pulmonary/Chest: Effort normal and breath sounds normal. No respiratory distress. She has no wheezes. She has no rales.  Musculoskeletal: She exhibits no edema.  Neurological: She is alert and oriented to person, place, and time.  Psychiatric: She has a normal mood and affect. Her behavior is normal.          Assessment & Plan:  #1 dementia. Try titration of Namenda 10 mg twice daily. We discussed combination therapy. Considered trial of Exelon patch-but there is caution with Exelon use in individuals who have had prior intolerance to Aricept. Reassess one month #2 hyperlipidemia. Schedule labs with physical with lipid and hepatic panel  #3 health maintenance. Flu vaccine given. Pneumovax up-to-date. Schedule repeat colonoscopy. Schedule mammogram and DEXA scan. #4 history of colon cancer. Patient due for repeat colonoscopy

## 2012-08-27 NOTE — Patient Instructions (Signed)
Schedule complete physical. 

## 2012-09-02 ENCOUNTER — Encounter: Payer: Self-pay | Admitting: Family Medicine

## 2012-09-02 ENCOUNTER — Ambulatory Visit (INDEPENDENT_AMBULATORY_CARE_PROVIDER_SITE_OTHER): Payer: Medicare Other | Admitting: Family Medicine

## 2012-09-02 VITALS — BP 130/80 | HR 72 | Temp 97.2°F | Resp 12 | Ht 71.5 in | Wt 178.0 lb

## 2012-09-02 DIAGNOSIS — Z85038 Personal history of other malignant neoplasm of large intestine: Secondary | ICD-10-CM

## 2012-09-02 DIAGNOSIS — Z0000 Encounter for general adult medical examination without abnormal findings: Secondary | ICD-10-CM

## 2012-09-02 DIAGNOSIS — Z23 Encounter for immunization: Secondary | ICD-10-CM

## 2012-09-02 DIAGNOSIS — M899 Disorder of bone, unspecified: Secondary | ICD-10-CM

## 2012-09-02 DIAGNOSIS — E785 Hyperlipidemia, unspecified: Secondary | ICD-10-CM

## 2012-09-02 DIAGNOSIS — M8580 Other specified disorders of bone density and structure, unspecified site: Secondary | ICD-10-CM

## 2012-09-02 DIAGNOSIS — F0390 Unspecified dementia without behavioral disturbance: Secondary | ICD-10-CM

## 2012-09-02 DIAGNOSIS — E039 Hypothyroidism, unspecified: Secondary | ICD-10-CM

## 2012-09-02 DIAGNOSIS — L259 Unspecified contact dermatitis, unspecified cause: Secondary | ICD-10-CM

## 2012-09-02 LAB — LIPID PANEL
Cholesterol: 173 mg/dL (ref 0–200)
HDL: 55.2 mg/dL (ref >39.00)
Total CHOL/HDL Ratio: 3
Triglycerides: 239 mg/dL — ABNORMAL HIGH (ref 0.0–149.0)
VLDL: 47.8 mg/dL — ABNORMAL HIGH (ref 0.0–40.0)

## 2012-09-02 LAB — HEPATIC FUNCTION PANEL
AST: 29 U/L (ref 0–37)
Albumin: 4.2 g/dL (ref 3.5–5.2)
Total Bilirubin: 0.7 mg/dL (ref 0.3–1.2)

## 2012-09-02 LAB — LDL CHOLESTEROL, DIRECT: Direct LDL: 80.8 mg/dL

## 2012-09-02 LAB — BASIC METABOLIC PANEL
CO2: 28 mEq/L (ref 19–32)
Chloride: 105 mEq/L (ref 96–112)
Creatinine, Ser: 1.1 mg/dL (ref 0.4–1.2)
Potassium: 4.3 mEq/L (ref 3.5–5.1)

## 2012-09-02 MED ORDER — PREDNISONE 10 MG PO TABS: ORAL_TABLET | ORAL | Status: DC

## 2012-09-02 MED ORDER — TETANUS-DIPHTH-ACELL PERTUSSIS 5-2.5-18.5 LF-MCG/0.5 IM SUSP: 0.5000 mL | Freq: Once | INTRAMUSCULAR | Status: DC

## 2012-09-02 NOTE — Progress Notes (Signed)
Subjective:    Patient ID: Sophia Wright, female    DOB: 29-Mar-1941, 71 y.o.   MRN: [id number]  HPI  Patient here for Medicare wellness exam and medical followup. Her chronic medical problems include history of hyperlipidemia, dementia, glaucoma, history of colon cancer, depression, hypothyroidism, osteopenia, and remote history of migraine headaches. Medications reviewed. Compliant with all. Currently in process of titrating Namenda to 10 mg twice daily. Question of rash previously with Aricept. Mood is stable. No recent sudden cognitive changes. She's been scheduled for a colonoscopy. Flu vaccine given last visit. Pneumovax up to date. Previous shingles vaccine 2008. Tetanus was not up-to-date. DEXA scan March 2012 revealing osteopenia. No consistent use of calcium or vitamin D.  Separate problem of recurrent pruritic rash mostly involving forearms. Seen previously by dermatology and improved with prednisone. No clear precipitants. No new topicals. No history of clothing allergy. Present for several days. No alleviating factors. No history of photosensitivity rash. Rash is fairly well demarcated upper extremities and slightly behind left knee  1.  Risk factors based on Past Medical , Social, and Family history reviewed and as above 2.  Limitations in physical activities low risk for fall 3.Depression/mood history of depression stable on medication 4.  Hearing no deficits 5.  ADLs remains independent in all 6.  Cognitive function (orientation to time and place, language, writing, speech,memory) cognitive impairment. Refer to prior mental status exams. Currently on Namenda. She is impairment of short-term memory 7.  Home Safety no issues 8.  Height, weight, and visual acuity. Height and weight stable. She's had mild height loss of 1/2 inch 9.  Counseling  discussed weightbearing exercise. 10. Recommendation of preventive services. Tdap given. 11. Labs based on risk factors lipid, hepatic, vitamin  D level, basic metabolic panel, and TSH 12. Care Plan as above   Past Medical History  Diagnosis Date  . Arthritis   . Depression   . Glaucoma   . Allergy   . Thyroid disease   . Migraines   . Hyperlipidemia    Past Surgical History  Procedure Date  . Colon surgery 1995    cancer    reports that she has never smoked. She does not have any smokeless tobacco history on file. Her alcohol and drug histories not on file. family history includes Alcohol abuse in her father and Cancer in her paternal grandmother. Allergies  Allergen Reactions  . Aricept (Donepezil Hcl) Itching      Review of Systems  Constitutional: Negative for fever, activity change, appetite change, fatigue and unexpected weight change.  HENT: Negative for hearing loss, ear pain, sore throat and trouble swallowing.   Eyes: Negative for visual disturbance.  Respiratory: Negative for cough, shortness of breath and wheezing.   Cardiovascular: Negative for chest pain and palpitations.  Gastrointestinal: Negative for abdominal pain, diarrhea, constipation and blood in stool.  Genitourinary: Negative for dysuria and hematuria.  Musculoskeletal: Negative for myalgias, back pain and arthralgias.  Skin: Negative for rash.  Neurological: Negative for dizziness, syncope and headaches.  Hematological: Negative for adenopathy.  Psychiatric/Behavioral: Negative for disturbed wake/sleep cycle, dysphoric mood and agitation.       Objective:   Physical Exam  Constitutional: She is oriented to person, place, and time. She appears well-developed and well-nourished.  HENT:  Head: Normocephalic and atraumatic.  Eyes: EOM are normal. Pupils are equal, round, and reactive to light.  Neck: Normal range of motion. Neck supple. No thyromegaly present.  Cardiovascular: Normal rate, regular rhythm and  normal heart sounds.   No murmur heard. Pulmonary/Chest: Breath sounds normal. No respiratory distress. She has no wheezes. She  has no rales.  Abdominal: Soft. Bowel sounds are normal. She exhibits no distension and no mass. There is no tenderness. There is no rebound and no guarding.  Genitourinary:       Breasts are symmetric with no mass. We discussed pros and cons of ongoing Pap smears at this point she elects not to. Last Pap smear reportedly about 2 years ago per gynecologist normal. No major risk factors.  Musculoskeletal: Normal range of motion. She exhibits no edema.  Lymphadenopathy:    She has no cervical adenopathy.  Neurological: She is alert and oriented to person, place, and time. She displays normal reflexes. No cranial nerve deficit.  Skin: Rash noted.       Patient has erythematous rash fairly symmetric distribution both forearms and arms including lateral and medial aspect. Nonscaly. No vesicles. No pustules. Nontender. Not warm to touch. Blanches slightly with pressure.  Psychiatric: She has a normal mood and affect. Her behavior is normal. Judgment and thought content normal.          Assessment & Plan:  #1 health maintenance. Discussed appropriate levels of calcium and vitamin D supplementation. Repeat DEXA scan by next spring (2014). Immunizations up to date with the exception of tetanus. We'll give tetanus booster today. Discussed weightbearing exercise.  #2 pruritic rash. Brief taper of prednisone. If recurs consider dermatology referral. Suspect contact dermatitis  #3 hypothyroidism. Recheck TSH  #4 hyperlipidemia. Check lipid and hepatic panel. #5 cognitive impairment with probable Alzheimer's dementia. Continue titration of Namenda. Reassess 3 months repeat mental status exam then

## 2012-09-02 NOTE — Patient Instructions (Signed)
Calcium Intake Recommendations Age group / Amount of calcium to consume daily, in milligrams (mg)  0 to 6 months / 210 mg   7 to 12 months / 270 mg   1 to 3 years / 500 mg   4 to 8 years / 800 mg   9 to 18 years / 1,300 mg   19 to 50 years / 1,000 mg   51 to 70+ years / 1,200 mg   Pregnant and nursing, under 19 years / 1,300 mg   Pregnant and nursing, over 19 years / 1,000 mg  Document Released: 07/03/2004 Document Revised: 11/08/2011 Document Reviewed: 11/19/2005 [facility] Patient Information 2012 [place], [place].  Make sure you are getting at least 800 IU of Vit D daily Weight bearing exercise such as walking as much as possible.

## 2012-09-03 ENCOUNTER — Other Ambulatory Visit: Payer: Self-pay | Admitting: *Deleted

## 2012-09-03 LAB — VITAMIN D 25 HYDROXY (VIT D DEFICIENCY, FRACTURES): Vit D, 25-Hydroxy: 21 ng/mL — ABNORMAL LOW (ref 30–89)

## 2012-09-03 MED ORDER — VITAMIN D (ERGOCALCIFEROL) 1.25 MG (50000 UNIT) PO CAPS: 50000.0000 [IU] | ORAL_CAPSULE | ORAL | Status: DC

## 2012-09-03 MED ORDER — LEVOTHYROXINE SODIUM 88 MCG PO TABS: 88.0000 ug | ORAL_TABLET | Freq: Every day | ORAL | Status: DC

## 2012-09-03 NOTE — Progress Notes (Signed)
Quick Note:  Pt husband informed, new med sent ______

## 2012-09-03 NOTE — Progress Notes (Signed)
Quick Note:  Husband informed, new med sent ______

## 2012-09-08 ENCOUNTER — Telehealth: Payer: Self-pay | Admitting: Family Medicine

## 2012-09-08 NOTE — Telephone Encounter (Signed)
Mr Stenerson informed one tab weekly by mouth.

## 2012-09-08 NOTE — Telephone Encounter (Signed)
Requesting to speak with Sophia Wright in regards to instructions on taking Vitamin D.

## 2012-09-10 ENCOUNTER — Telehealth: Payer: Self-pay | Admitting: Family Medicine

## 2012-09-10 MED ORDER — MEMANTINE HCL 5 MG PO TABS: 5.0000 mg | ORAL_TABLET | Freq: Two times a day (BID) | ORAL | Status: DC

## 2012-09-10 NOTE — Telephone Encounter (Signed)
Pts spouse called and said that the script is going to run out before 30 days. Pt needs a new script for memantine (NAMENDA) 10 MG tablet bid to [facility] on [address] of [address].

## 2012-09-11 ENCOUNTER — Telehealth: Payer: Self-pay | Admitting: Family Medicine

## 2012-09-11 MED ORDER — MEMANTINE HCL 10 MG PO TABS: 10.0000 mg | ORAL_TABLET | Freq: Two times a day (BID) | ORAL | Status: DC

## 2012-09-11 NOTE — Telephone Encounter (Signed)
Husband calling about wife's Namenda. She was on 5mg  tab BID and was supposed to build up to 10mg  BID. However, the rx has run out. He would like a new rx that states "10mg  twice a day" - please send to [facility] on [address]

## 2012-09-11 NOTE — Telephone Encounter (Signed)
Please advise 

## 2012-09-11 NOTE — Telephone Encounter (Signed)
Okay to send a new prescription for Namenda 10 mg twice daily and refill for 6 months

## 2012-09-11 NOTE — Telephone Encounter (Signed)
Pt husband informed

## 2012-09-17 ENCOUNTER — Ambulatory Visit: Payer: Medicare Other | Admitting: Licensed Clinical Social Worker

## 2012-10-07 ENCOUNTER — Encounter: Payer: Self-pay | Admitting: Family Medicine

## 2012-10-14 ENCOUNTER — Other Ambulatory Visit: Payer: Self-pay | Admitting: Family Medicine

## 2012-12-04 ENCOUNTER — Ambulatory Visit (INDEPENDENT_AMBULATORY_CARE_PROVIDER_SITE_OTHER): Payer: Medicare Other | Admitting: Family Medicine

## 2012-12-04 ENCOUNTER — Encounter: Payer: Self-pay | Admitting: Family Medicine

## 2012-12-04 VITALS — BP 140/78 | Temp 97.8°F | Wt 176.0 lb

## 2012-12-04 DIAGNOSIS — E039 Hypothyroidism, unspecified: Secondary | ICD-10-CM

## 2012-12-04 DIAGNOSIS — F0390 Unspecified dementia without behavioral disturbance: Secondary | ICD-10-CM

## 2012-12-04 DIAGNOSIS — Z8659 Personal history of other mental and behavioral disorders: Secondary | ICD-10-CM

## 2012-12-04 LAB — TSH: TSH: 0.34 u[IU]/mL — ABNORMAL LOW (ref 0.35–5.50)

## 2012-12-04 NOTE — Progress Notes (Signed)
  Subjective:    Patient ID: Sophia Wright, female    DOB: 1941-10-11, 72 y.o.   MRN: [id number]  HPI Medical followup. Patient has history of dementia. Currently on Namenda 10 mg twice daily. She had intolerance with Aricept with itching. Patient and husband think her memory is slightly improved on Namenda. No side effects. She's trying to do things like puzzles and still reads avidly  Hypothyroidism. Slightly under replaced last check. Needs repeat TSH today after titration of levothyroxin 88 mcg daily. Compliant with therapy.  History of depression. Currently on Pristiq 50 mg daily. Mood is stable. She still complains of some fatigue and low energy but no crying spells  Past Medical History  Diagnosis Date  . Arthritis   . Depression   . Glaucoma   . Allergy   . Thyroid disease   . Migraines   . Hyperlipidemia   . Osteopenia   . Dementia    Past Surgical History  Procedure Date  . Colon surgery 1995    cancer    reports that she has never smoked. She does not have any smokeless tobacco history on file. Her alcohol and drug histories not on file. family history includes Alcohol abuse in her father and Cancer in her paternal grandmother. Allergies  Allergen Reactions  . Aricept (Donepezil Hcl) Itching     Review of Systems  Constitutional: Positive for fatigue. Negative for appetite change and unexpected weight change.  Respiratory: Negative for shortness of breath.   Cardiovascular: Negative for chest pain.  Gastrointestinal: Negative for abdominal pain and constipation.  Neurological: Negative for dizziness.  Psychiatric/Behavioral: Negative for dysphoric mood.       Objective:   Physical Exam  Constitutional: She is oriented to person, place, and time. She appears well-developed and well-nourished.  HENT:  Mouth/Throat: Oropharynx is clear and moist.  Neck: Neck supple. No thyromegaly present.  Cardiovascular: Normal rate and regular rhythm.   Pulmonary/Chest:  Effort normal and breath sounds normal. No respiratory distress. She has no wheezes. She has no rales.  Musculoskeletal: She exhibits no edema.  Neurological: She is alert and oriented to person, place, and time.  Psychiatric: She has a normal mood and affect.       Mini-Mental Status exam 24/30 which is actually improved compared to last exam when she scored 18/30          Assessment & Plan:  Dementia. Stable with improvement his scores as above. Continue Namenda 10 mg twice daily  Hypothyroidism. Under replaced by last lab. Recheck TSH  History of depression. Currently stable on Pristiq.

## 2012-12-05 NOTE — Progress Notes (Signed)
Quick Note:  Pt husband informed ______ 

## 2013-01-12 ENCOUNTER — Other Ambulatory Visit: Payer: Self-pay | Admitting: Family Medicine

## 2013-02-25 ENCOUNTER — Other Ambulatory Visit: Payer: Self-pay | Admitting: *Deleted

## 2013-02-25 MED ORDER — SIMVASTATIN 20 MG PO TABS: ORAL_TABLET | ORAL | Status: DC

## 2013-03-02 ENCOUNTER — Encounter: Payer: Self-pay | Admitting: Internal Medicine

## 2013-03-02 ENCOUNTER — Ambulatory Visit (INDEPENDENT_AMBULATORY_CARE_PROVIDER_SITE_OTHER): Payer: Medicare Other | Admitting: Internal Medicine

## 2013-03-02 VITALS — BP 132/74 | Temp 97.6°F | Wt 180.0 lb

## 2013-03-02 DIAGNOSIS — R059 Cough, unspecified: Secondary | ICD-10-CM

## 2013-03-02 DIAGNOSIS — R05 Cough: Secondary | ICD-10-CM | POA: Insufficient documentation

## 2013-03-02 MED ORDER — MOMETASONE FUROATE 50 MCG/ACT NA SUSP: 2.0000 | Freq: Every day | NASAL | Status: DC

## 2013-03-02 NOTE — Progress Notes (Signed)
Subjective:    Patient ID: Sophia Wright, female    DOB: [birth year], 72 y.o.   MRN: [id number]  HPI  72 year old white female complains of 2-3 days of cough and nasal congestion. She denies any purulent sputum. Husband reports cough was severe yesterday afternoon and last night. She denies any wheezing or chest tightness. She has no history of asthma. She is a nonsmoker. Patient reports her symptoms does not feel like upper respiratory infection. She has history of allergies in the past but tree pollen has not bothered her.  Review of Systems Negative for fever or chills  Past Medical History  Diagnosis Date  . Arthritis   . Depression   . Glaucoma   . Allergy   . Thyroid disease   . Migraines   . Hyperlipidemia   . Osteopenia   . Dementia     History   Social History  . Marital Status: Married    Spouse Name: N/A    Number of Children: N/A  . Years of Education: N/A   Occupational History  . Not on file.   Social History Main Topics  . Smoking status: Never Smoker   . Smokeless tobacco: Not on file  . Alcohol Use: Not on file  . Drug Use: Not on file  . Sexually Active: Not on file   Other Topics Concern  . Not on file   Social History Narrative  . No narrative on file    Past Surgical History  Procedure Laterality Date  . Colon surgery  1995    cancer    Family History  Problem Relation Age of Onset  . Alcohol abuse Father   . Cancer Paternal Grandmother     colon    Allergies  Allergen Reactions  . Aricept (Donepezil Hcl) Itching    Current Outpatient Prescriptions on File Prior to Visit  Medication Sig Dispense Refill  . levothyroxine (SYNTHROID, LEVOTHROID) 88 MCG tablet Take 1 tablet (88 mcg total) by mouth daily.  90 tablet  3  . memantine (NAMENDA) 10 MG tablet Take 1 tablet (10 mg total) by mouth 2 (two) times daily.  60 tablet  5  . metoprolol tartrate (LOPRESSOR) 25 MG tablet TAKE 1/2 TABLET BY MOUTH TWICE DAILY  90 tablet  3  .  PRISTIQ 50 MG 24 hr tablet TAKE 1 TABLET BY MOUTH DAILY  90 tablet  3  . simvastatin (ZOCOR) 20 MG tablet TAKE 1 TABLET BY MOUTH EVERY NIGHT AT BEDTIME  90 tablet  3  . TRAVATAN Z 0.004 % ophthalmic solution Place 1 drop into both eyes at bedtime.       . Vitamin D, Ergocalciferol, (DRISDOL) 50000 UNITS CAPS Take 1 capsule (50,000 Units total) by mouth every 7 (seven) days.  16 capsule  2   No current facility-administered medications on file prior to visit.    BP 132/74  Temp(Src) 97.6 F (36.4 C) (Oral)  Wt 180 lb (81.647 kg)  BMI 24.76 kg/m2       Objective:   Physical Exam  Constitutional: She appears well-developed and well-nourished.  HENT:  Head: Normocephalic and atraumatic.  Right Ear: External ear normal.  Left Ear: External ear normal.  Slight paleness to nasal mucosa Signs of postnasal drip  Neck: Neck supple.  No neck tenderness  Cardiovascular: Normal rate, regular rhythm and normal heart sounds.   Pulmonary/Chest: Effort normal and breath sounds normal. She has no wheezes.  Assessment & Plan:

## 2013-03-02 NOTE — Patient Instructions (Addendum)
Use zyrtec 10 mg over the counter once daily  Also use nasal saline spray as directed Please contact our office if your symptoms do not improve or gets worse.

## 2013-03-02 NOTE — Assessment & Plan Note (Signed)
72 year old white female presents with cough. I suspect symptoms are secondary to postnasal drip. Trigger may be allergies. Use Zyrtec 10 mg over-the-counter once daily and Nasonex as directed. Patient advised to call office if symptoms persist or worsen.

## 2013-03-12 ENCOUNTER — Other Ambulatory Visit: Payer: Self-pay | Admitting: Family Medicine

## 2013-04-28 ENCOUNTER — Other Ambulatory Visit: Payer: Self-pay | Admitting: *Deleted

## 2013-04-28 MED ORDER — DESVENLAFAXINE SUCCINATE ER 50 MG PO TB24: ORAL_TABLET | ORAL | Status: DC

## 2013-06-03 ENCOUNTER — Ambulatory Visit (INDEPENDENT_AMBULATORY_CARE_PROVIDER_SITE_OTHER): Payer: Medicare Other | Admitting: Family Medicine

## 2013-06-03 ENCOUNTER — Encounter: Payer: Self-pay | Admitting: Family Medicine

## 2013-06-03 VITALS — BP 132/68 | HR 68 | Temp 97.4°F | Ht 71.0 in | Wt 184.0 lb

## 2013-06-03 DIAGNOSIS — Z8659 Personal history of other mental and behavioral disorders: Secondary | ICD-10-CM

## 2013-06-03 DIAGNOSIS — E559 Vitamin D deficiency, unspecified: Secondary | ICD-10-CM | POA: Insufficient documentation

## 2013-06-03 DIAGNOSIS — E039 Hypothyroidism, unspecified: Secondary | ICD-10-CM

## 2013-06-03 DIAGNOSIS — F0390 Unspecified dementia without behavioral disturbance: Secondary | ICD-10-CM

## 2013-06-03 NOTE — Progress Notes (Signed)
  Subjective:    Patient ID: Sophia Wright, female    DOB: [birth year], 72 y.o.   MRN: [id number]  HPI Medical followup Patient has dementia, hyperlipidemia, osteopenia, history of glaucoma, history of recurrent depression, hypothyroidism. Medications reviewed and compliant with all. She is accompanied by her husband. Mental status is stable. No agitation. She continues to read and engage in crossword puzzles.  Hypothyroidism treated with levothyroxin 88 mcg daily. Most recent TSH was very minimally over replaced. She's actually had some mild weight gain since last visit. Good appetite.  She complains of some hot flushes over the past couple of months. She takes pristiq for depression and this has worked very well. She is very upbeat mood and generally feels well.  No sleep difficulties.  Past Medical History  Diagnosis Date  . Arthritis   . Depression   . Glaucoma   . Allergy   . Thyroid disease   . Migraines   . Hyperlipidemia   . Osteopenia   . Dementia    Past Surgical History  Procedure Laterality Date  . Colon surgery  1995    cancer    reports that she has never smoked. She does not have any smokeless tobacco history on file. Her alcohol and drug histories are not on file. family history includes Alcohol abuse in her father and Cancer in her paternal grandmother. Allergies  Allergen Reactions  . Aricept (Donepezil Hcl) Itching      Review of Systems  Constitutional: Negative for fatigue and unexpected weight change.  Eyes: Negative for visual disturbance.  Respiratory: Negative for cough, chest tightness, shortness of breath and wheezing.   Cardiovascular: Negative for chest pain, palpitations and leg swelling.  Endocrine: Negative for polydipsia and polyuria.  Neurological: Negative for dizziness, seizures, syncope, weakness, light-headedness and headaches.       Objective:   Physical Exam  Constitutional: She appears well-developed and well-nourished.   Neck: Neck supple. No thyromegaly present.  Cardiovascular: Normal rate and regular rhythm.   Pulmonary/Chest: Effort normal and breath sounds normal. No respiratory distress. She has no wheezes. She has no rales.  Musculoskeletal: She exhibits no edema.  She has some lower extremity varicosities but no edema          Assessment & Plan:  #1 dementia. Stable. Continue Namenda. Previous intolerance to Aricept #2 hot flashes. Possibly related to medication such as Pristiq (norepinephrine effect). Rule out over replaced thyroid #3 hypothyroidism. Recheck TSH #4 depression which is stable on medication above

## 2013-07-13 ENCOUNTER — Other Ambulatory Visit: Payer: Self-pay | Admitting: Family Medicine

## 2013-08-08 ENCOUNTER — Other Ambulatory Visit: Payer: Self-pay | Admitting: Family Medicine

## 2013-09-20 ENCOUNTER — Other Ambulatory Visit: Payer: Self-pay | Admitting: Family Medicine

## 2013-10-24 ENCOUNTER — Other Ambulatory Visit: Payer: Self-pay | Admitting: Family Medicine

## 2013-11-30 ENCOUNTER — Other Ambulatory Visit: Payer: Self-pay | Admitting: Family Medicine

## 2013-12-08 ENCOUNTER — Ambulatory Visit: Payer: Medicare Other | Admitting: Family Medicine

## 2013-12-16 ENCOUNTER — Ambulatory Visit: Payer: Medicare Other | Admitting: Family Medicine

## 2013-12-24 ENCOUNTER — Ambulatory Visit (INDEPENDENT_AMBULATORY_CARE_PROVIDER_SITE_OTHER): Payer: Medicare Other | Admitting: Family Medicine

## 2013-12-24 ENCOUNTER — Encounter: Payer: Self-pay | Admitting: Family Medicine

## 2013-12-24 VITALS — BP 132/74 | HR 71 | Temp 97.5°F | Wt 182.0 lb

## 2013-12-24 DIAGNOSIS — E785 Hyperlipidemia, unspecified: Secondary | ICD-10-CM

## 2013-12-24 DIAGNOSIS — F0390 Unspecified dementia without behavioral disturbance: Secondary | ICD-10-CM

## 2013-12-24 DIAGNOSIS — Z8659 Personal history of other mental and behavioral disorders: Secondary | ICD-10-CM

## 2013-12-24 DIAGNOSIS — E039 Hypothyroidism, unspecified: Secondary | ICD-10-CM

## 2013-12-24 LAB — TSH: TSH: 0.89 u[IU]/mL (ref 0.35–5.50)

## 2013-12-24 MED ORDER — ARIPIPRAZOLE 2 MG PO TABS: 2.0000 mg | ORAL_TABLET | Freq: Every day | ORAL | Status: DC

## 2013-12-24 NOTE — Progress Notes (Signed)
Pre visit review using our clinic review tool, if applicable. No additional management support is needed unless otherwise documented below in the visit note. 

## 2013-12-24 NOTE — Progress Notes (Signed)
   Subjective:    Patient ID: Sophia HillockJean H Mclees, female    DOB: 1941/09/18, 73 y.o.   MRN: [id number]  HPI Patient has history of dementia, osteopenia, hypothyroidism, hyperlipidemia, remote history of colon cancer. She is seen in followup accompanied by son. They have concerns that she's had some increased depression especially over the holidays. Low motivation. Frequent getting out of bed for several hours. She is currently treated with Pristiq.  Her weight is stable. They think she may be sleeping more than usual.  Both son and husband note that she's had some progressive memory loss. She remains on the Namenda twice daily. Previous intolerance to Aricept. She tends to sit around and not be engaged in activities most of the day.  She remains on levothyroxin and compliant with therapy. Hyperlipidemia to simvastatin. No history of CAD. No recent falls.  Past Medical History  Diagnosis Date  . Arthritis   . Depression   . Glaucoma   . Allergy   . Thyroid disease   . Migraines   . Hyperlipidemia   . Osteopenia   . Dementia    Past Surgical History  Procedure Laterality Date  . Colon surgery  1995    cancer    reports that she has never smoked. She does not have any smokeless tobacco history on file. Her alcohol and drug histories are not on file. family history includes Alcohol abuse in her father; Cancer in her paternal grandmother. Allergies  Allergen Reactions  . Aricept [Donepezil Hcl] Itching      Review of Systems  Constitutional: Positive for fatigue. Negative for fever, chills and unexpected weight change.  Respiratory: Negative for cough and shortness of breath.   Cardiovascular: Negative for chest pain.  Gastrointestinal: Negative for abdominal pain.  Endocrine: Negative for polydipsia and polyuria.  Genitourinary: Negative for dysuria.  Neurological: Negative for dizziness, weakness and headaches.  Psychiatric/Behavioral: Positive for dysphoric mood. Negative for  suicidal ideas, hallucinations and agitation.       Objective:   Physical Exam  Constitutional: She is oriented to person, place, and time. She appears well-developed and well-nourished.  Neck: Neck supple. No thyromegaly present.  Cardiovascular: Normal rate.   Pulmonary/Chest: Effort normal and breath sounds normal. No respiratory distress. She has no wheezes. She has no rales.  Musculoskeletal: She exhibits no edema.  Neurological: She is alert and oriented to person, place, and time.  Psychiatric: She has a normal mood and affect. Her behavior is normal. Thought content normal.          Assessment & Plan:  #1 history of depression. She has low motivation and difficulty sort out how much of this is related to her progression of dementia versus depression. We did administer PHQ- 9 questionnaire and she scored 16. reliable, this would indicate moderately severe depression. We agreed add back Abilify 2 mg each bedtime to her Pristiq and reassess one month #2 progressive dementia. Continue Namenda. We strongly advise family to keep her engaged as much as possible in activities #3 hypothyroidism. Recheck TSH #4 hyperlipidemia. Repeat lipid and hepatic panel

## 2013-12-25 LAB — HEPATIC FUNCTION PANEL
ALT: 19 U/L (ref 0–35)
AST: 28 U/L (ref 0–37)
Albumin: 4.2 g/dL (ref 3.5–5.2)
Alkaline Phosphatase: 80 U/L (ref 39–117)
BILIRUBIN TOTAL: 0.7 mg/dL (ref 0.3–1.2)
Bilirubin, Direct: 0 mg/dL (ref 0.0–0.3)
Total Protein: 7 g/dL (ref 6.0–8.3)

## 2013-12-25 LAB — BASIC METABOLIC PANEL
BUN: 23 mg/dL (ref 6–23)
CHLORIDE: 106 meq/L (ref 96–112)
CO2: 29 meq/L (ref 19–32)
CREATININE: 1.1 mg/dL (ref 0.4–1.2)
Calcium: 9.3 mg/dL (ref 8.4–10.5)
GFR: 53.91 mL/min — ABNORMAL LOW (ref >60.00)
Glucose, Bld: 68 mg/dL — ABNORMAL LOW (ref 70–99)
Potassium: 4 mEq/L (ref 3.5–5.1)
SODIUM: 142 meq/L (ref 135–145)

## 2013-12-25 LAB — LIPID PANEL
CHOL/HDL RATIO: 3
Cholesterol: 149 mg/dL (ref 0–200)
HDL: 46.9 mg/dL (ref >39.00)
TRIGLYCERIDES: 231 mg/dL — AB (ref 0.0–149.0)
VLDL: 46.2 mg/dL — AB (ref 0.0–40.0)

## 2013-12-25 LAB — LDL CHOLESTEROL, DIRECT: LDL DIRECT: 75.2 mg/dL

## 2014-01-21 ENCOUNTER — Ambulatory Visit: Payer: Medicare Other | Admitting: Family Medicine

## 2014-01-28 ENCOUNTER — Ambulatory Visit: Payer: Medicare Other | Admitting: Family Medicine

## 2014-02-04 ENCOUNTER — Encounter: Payer: Self-pay | Admitting: Family Medicine

## 2014-02-04 ENCOUNTER — Ambulatory Visit (INDEPENDENT_AMBULATORY_CARE_PROVIDER_SITE_OTHER): Payer: Medicare Other | Admitting: Family Medicine

## 2014-02-04 VITALS — BP 130/78 | HR 67 | Wt 186.0 lb

## 2014-02-04 DIAGNOSIS — F0390 Unspecified dementia without behavioral disturbance: Secondary | ICD-10-CM

## 2014-02-04 DIAGNOSIS — Z8659 Personal history of other mental and behavioral disorders: Secondary | ICD-10-CM

## 2014-02-04 MED ORDER — RIVASTIGMINE 4.6 MG/24HR TD PT24: 4.6000 mg | MEDICATED_PATCH | Freq: Every day | TRANSDERMAL | Status: DC

## 2014-02-04 NOTE — Progress Notes (Signed)
   Subjective:    Patient ID: Sophia Wright, female    DOB: Jun 13, 1941, 73 y.o.   MRN: [id number]  HPI Patient seen for followup regarding dementia and depression. Refer to previous note. We added Abilify 2 mg at night and family thinks she has had perhaps slightly more energy. She is resting fairly well. She had PHQ9 score of 16 last visit and scores 7 today. No adverse side effects. Husband is concerned because she has had progressive dementia over several months. She's got to the point where she occasionally does not recognize him. She is still fairly independent in ADLs. She has been on Namenda 10 mg twice daily for several months. She cannot take Aricept secondary to nausea.  Past Medical History  Diagnosis Date  . Arthritis   . Depression   . Glaucoma   . Allergy   . Thyroid disease   . Migraines   . Hyperlipidemia   . Osteopenia   . Dementia    Past Surgical History  Procedure Laterality Date  . Colon surgery  1995    cancer    reports that she has never smoked. She does not have any smokeless tobacco history on file. Her alcohol and drug histories are not on file. family history includes Alcohol abuse in her father; Cancer in her paternal grandmother. Allergies  Allergen Reactions  . Aricept [Donepezil Hcl] Itching      Review of Systems  Constitutional: Negative for appetite change and unexpected weight change.  Respiratory: Negative for shortness of breath.   Cardiovascular: Negative for chest pain.  Psychiatric/Behavioral: Positive for confusion and dysphoric mood. Negative for suicidal ideas, sleep disturbance and agitation.       Objective:   Physical Exam  Constitutional: She appears well-developed and well-nourished.  Cardiovascular: Normal rate.   Pulmonary/Chest: Effort normal and breath sounds normal. No respiratory distress. She has no wheezes. She has no rales.  Musculoskeletal: She exhibits no edema.  Neurological: She is alert.  Patient's and not  oriented to day of week or year.          Assessment & Plan:  #1 advanced dementia. She's had some progressive changes on Namenda. We have recommended followup Exelon patch 4.6 mg daily. Reassess one month #2 history of recurrent depression. Stable. She scores PHQ-9 today of 7 which is improved from 16 last visit but question validity.  Over 25 minutes spent with her husband and son and patient assessing and evaluating.

## 2014-02-04 NOTE — Patient Instructions (Signed)
Rivastigmine skin patches What is this medicine? RIVASTIGMINE (ri va STIG meen) is used to treat mild to moderate dementia caused by Parkinson's disease and mild to severe Alzheimer's disease. This medicine may be used for other purposes; ask your health care provider or pharmacist if you have questions. COMMON BRAND NAME(S): Exelon Patch What should I tell my health care provider before I take this medicine? They need to know if you have any of these conditions: -application site reaction during previous use of rivastigmine patch -heart disease -kidney disease -liver disease -lung or breathing disease, like asthma -seizures -slow, irregular heartbeat -stomach or intestine disease, ulcers, or stomach bleeding -trouble passing urine -an unusual or allergic reaction to rivastigmine, other medicines, foods, dyes, or preservatives -pregnant or trying to get pregnant -breast-feeding How should I use this medicine? This medicine is for external use only. Follow the directions on the prescription label. Always remove the old patch before you apply a new one. Apply to skin right after removing the protective liner. Do not cut or trim the patch. Apply to an area of the upper arm, chest, or back that is clean, dry and hairless. Avoid injured, irritated, oily, or calloused areas or where the patch will be rubbed by tight clothing or a waistband. Do not place over an area where lotion, cream, or powder was recently used. Press firmly in place until the edges stick well. To prevent skin irritiation, do not apply to the same place more than once every 14 days. Change your patch at the same time each day. Do not use more often than directed. Do not stop using except on your doctor's advice. Contact your pediatrician or health care professional regarding the use of this medicine in children. Special care may be needed. Overdosage: If you think you have taken too much of this medicine contact a poison control  center or emergency room at once. NOTE: This medicine is only for you. Do not share this medicine with others. What if I miss a dose? If you miss a dose, apply a new patch immediately. Then, apply the next patch at the usual time the next day after removing the previous patch. Do not apply 2 patches to make up for the missed one. If treatment is missed for 3 or more days, contact your healthcare provider for further instructions. What may interact with this medicine? -antihistamines for allergy, cough, and cold -atropine -certain medicines for bladder problems like oxybutynin, tolterodine -certain medicines for Parkinson's disease like benztropine, trihexyphenidyl -certain medicines for stomach problems like dicyclomine, hyoscyamine -glycopyrrolate -ipratropium -certain medicines for travel sickness like scopolamine -medicines that relax your muscles for surgery -other medicines for Alzheimer's disease This list may not describe all possible interactions. Give your health care provider a list of all the medicines, herbs, non-prescription drugs, or dietary supplements you use. Also tell them if you smoke, drink alcohol, or use illegal drugs. Some items may interact with your medicine. What should I watch for while using this medicine? Visit your doctor or health care professional for regular checks on your progress. Check with your doctor or health care professional if your symptoms do not start to get better or if they get worse. You may get drowsy or dizzy. Do not drive, use machinery, or do anything that needs mental alertness until you know how this drug affects you. Do not stand or sit up quickly, especially if you are an older patient. This reduces the risk of dizzy or fainting spells. Avoid saunas   and prolonged exposure to sunlight. You may bathe, swim, shower, or participate in any of your normal activities while wearing this patch. If the patch falls off, apply a new patch for the rest of  the day, then replace the patch the next day at the usual time. What side effects may I notice from receiving this medicine? Side effects that you should report to your doctor or health care professional as soon as possible: -allergic reactions like skin rash, itching or hives, swelling of the face, lips, or tongue -application site reaction (such as skin redness, blisters, or swelling under or around the patch site) -changes in vision or balance -dizziness -feeling faint or lightheaded, falls -increase in frequency of passing urine or incontinence -nervousness, agitation, or increased confusion -redness, blistering, peeling or loosening of the skin, including inside the mouth -severe diarrhea -slow heartbeat or palpitations -stomach pain -sweating -uncontrollable movements -vomiting -weight loss Side effects that usually do not require medical attention (report to your doctor or health care professional if they continue or are bothersome): -headache -indigestion or heartburn -loss of appetite -mild diarrhea, especially when starting treatment -nausea This list may not describe all possible side effects. Call your doctor for medical advice about side effects. You may report side effects to FDA at 1-800-FDA-1088. Where should I keep my medicine? Keep out of the reach of children. Store at room temperature between 15 and 30 degrees C (59 and 86 degrees F). Store in original pouch until just prior to use. Throw away any unused medicine after the expiration date. Dispose of used patches properly. Since used patches may still contain active medicine, fold the patch in half so that it sticks to itself prior to disposal. NOTE: This sheet is a summary. It may not cover all possible information. If you have questions about this medicine, talk to your doctor, pharmacist, or health care provider.  2014, [employer]. (2012-08-18 18:30:53)  

## 2014-02-04 NOTE — Progress Notes (Signed)
Pre visit review using our clinic review tool, if applicable. No additional management support is needed unless otherwise documented below in the visit note. 

## 2014-03-11 ENCOUNTER — Encounter: Payer: Self-pay | Admitting: Family Medicine

## 2014-03-11 ENCOUNTER — Ambulatory Visit (INDEPENDENT_AMBULATORY_CARE_PROVIDER_SITE_OTHER): Payer: Medicare Other | Admitting: Family Medicine

## 2014-03-11 VITALS — BP 130/80 | HR 69 | Wt 181.0 lb

## 2014-03-11 DIAGNOSIS — Z8659 Personal history of other mental and behavioral disorders: Secondary | ICD-10-CM

## 2014-03-11 DIAGNOSIS — F0390 Unspecified dementia without behavioral disturbance: Secondary | ICD-10-CM

## 2014-03-11 MED ORDER — RIVASTIGMINE 9.5 MG/24HR TD PT24: 9.5000 mg | MEDICATED_PATCH | Freq: Every day | TRANSDERMAL | Status: DC

## 2014-03-11 NOTE — Progress Notes (Signed)
Pre visit review using our clinic review tool, if applicable. No additional management support is needed unless otherwise documented below in the visit note. 

## 2014-03-11 NOTE — Progress Notes (Signed)
   Subjective:    Patient ID: Sophia Wright, female    DOB: Jun 14, 1941, 73 y.o.   MRN: [id number]  HPI Patient has chronic problems including Alzheimer's dementia, recurrent depression, hyperlipidemia, hypothyroidism, glaucoma. She has been previously intolerant of Aricept secondary to nausea. She currently takes Namenda 10 mg twice daily. We recently added Exelon patch 4.6 mg. Did not seen any improvement with regard to memory but she seems more alert and engaged and they feel less depressed. She also remains on antidepressant medication. She's not any nausea or other adverse side effects. She has some minimal redness at the site. There apparently is an insurance issue with coverage for Exelon patch.  Past Medical History  Diagnosis Date  . Arthritis   . Depression   . Glaucoma   . Allergy   . Thyroid disease   . Migraines   . Hyperlipidemia   . Osteopenia   . Dementia    Past Surgical History  Procedure Laterality Date  . Colon surgery  1995    cancer    reports that she has never smoked. She does not have any smokeless tobacco history on file. Her alcohol and drug histories are not on file. family history includes Alcohol abuse in her father; Cancer in her paternal grandmother. Allergies  Allergen Reactions  . Aricept [Donepezil Hcl] Itching       Review of Systems  Constitutional: Negative for appetite change and unexpected weight change.  Neurological: Negative for headaches.       Objective:   Physical Exam  Constitutional: She is oriented to person, place, and time. She appears well-developed and well-nourished.  Cardiovascular: Normal rate and regular rhythm.   Pulmonary/Chest: Effort normal and breath sounds normal. No respiratory distress. She has no wheezes. She has no rales.  Musculoskeletal: She exhibits no edema.  Neurological: She is alert and oriented to person, place, and time. No cranial nerve deficit.          Assessment & Plan:  #1 Alzheimer's  dementia. Patient has tolerated Exelon patch without any difficulty. Try titrating up to 9.5 mg patch. Reassess 2 months. Continue Namenda. #2 history of depression which is stable and per her husband improved.

## 2014-03-28 ENCOUNTER — Other Ambulatory Visit: Payer: Self-pay | Admitting: Family Medicine

## 2014-04-27 ENCOUNTER — Other Ambulatory Visit: Payer: Self-pay | Admitting: Family Medicine

## 2014-05-13 ENCOUNTER — Ambulatory Visit: Payer: Medicare Other | Admitting: Family Medicine

## 2014-05-14 ENCOUNTER — Encounter: Payer: Self-pay | Admitting: Family Medicine

## 2014-05-14 ENCOUNTER — Ambulatory Visit (INDEPENDENT_AMBULATORY_CARE_PROVIDER_SITE_OTHER): Payer: Medicare Other | Admitting: Family Medicine

## 2014-05-14 VITALS — BP 130/78 | HR 62 | Wt 183.0 lb

## 2014-05-14 DIAGNOSIS — Z8659 Personal history of other mental and behavioral disorders: Secondary | ICD-10-CM

## 2014-05-14 DIAGNOSIS — F0390 Unspecified dementia without behavioral disturbance: Secondary | ICD-10-CM

## 2014-05-14 DIAGNOSIS — R600 Localized edema: Secondary | ICD-10-CM

## 2014-05-14 DIAGNOSIS — E039 Hypothyroidism, unspecified: Secondary | ICD-10-CM

## 2014-05-14 DIAGNOSIS — Z23 Encounter for immunization: Secondary | ICD-10-CM

## 2014-05-14 DIAGNOSIS — R609 Edema, unspecified: Secondary | ICD-10-CM

## 2014-05-14 NOTE — Patient Instructions (Signed)
Elevated legs as much as possible. Try to leave off Aleve as much as possible.

## 2014-05-14 NOTE — Progress Notes (Signed)
   Subjective:    Patient ID: Sophia Wright, female    DOB: [birth year], 73 y.o.   MRN: [id number]  HPI Followup multiple medical problems. She has history of dementia, hypothyroidism, hypertension. She's had some recent bilateral foot edema which is worse late in the day. No real leg edema. No dyspnea. had echocardiogram way back in 2006 which revealed normal systolic function. She does not taking diuretics. No recent dietary changes.  Hypothyroidism which is treated with levothyroxin. TSH at goal back in January. Hyperlipidemia treated with simvastatin. Lipids and hepatic function were checked back in January  Dementia. She currently takes Namenda and we added Exelon patch currently 9.5 mg which she tolerates well except for some mild erythema at the site. Her husband thinks that she has more engaged in more motivated since starting this medication. No role changes in memory. Denies nausea or other side effects. No recent falls.  Past Medical History  Diagnosis Date  . Arthritis   . Depression   . Glaucoma   . Allergy   . Thyroid disease   . Migraines   . Hyperlipidemia   . Osteopenia   . Dementia    Past Surgical History  Procedure Laterality Date  . Colon surgery  1995    cancer    reports that she has never smoked. She does not have any smokeless tobacco history on file. Her alcohol and drug histories are not on file. family history includes Alcohol abuse in her father; Cancer in her paternal grandmother. Allergies  Allergen Reactions  . Aricept [Donepezil Hcl] Itching      Review of Systems  Constitutional: Negative for fever, chills, appetite change and unexpected weight change.  Respiratory: Negative for cough and shortness of breath.   Cardiovascular: Negative for chest pain.  Gastrointestinal: Negative for abdominal pain.  Genitourinary: Negative for dysuria.  Neurological: Negative for dizziness and syncope.  Psychiatric/Behavioral: Negative for sleep disturbance  and dysphoric mood.       Objective:   Physical Exam  Constitutional: She appears well-developed and well-nourished.  Neck: Neck supple. No thyromegaly present.  Cardiovascular: Normal rate and regular rhythm.   Pulmonary/Chest: Effort normal and breath sounds normal. No respiratory distress. She has no wheezes. She has no rales.  Musculoskeletal: She exhibits edema.  She has 1+ pitting edema involving the feet  Neurological: She is alert.          Assessment & Plan:  #1 Alzheimer's dementia. Symptomatically stable. Recent titration of Exelon patch and she seems to be more engaged. Reassess in 6 months #2 hypothyroidism. Continue levothyroxine. Recheck TSH at follow up #3 bilateral feet edema. Probably venous stasis related. Previous echocardiogram normal. Elevate legs frequently. We discussed compression nose and she is not interested. #4 hyperlipidemia. Continue simvastatin #5 health maintenance. Prevnar 13 given

## 2014-05-14 NOTE — Addendum Note (Signed)
Addended by: Thomasena EdisFLOYD, Swade Shonka E on: 05/14/2014 02:03 PM   Modules accepted: Orders

## 2014-05-14 NOTE — Progress Notes (Signed)
Pre visit review using our clinic review tool, if applicable. No additional management support is needed unless otherwise documented below in the visit note. 

## 2014-05-30 ENCOUNTER — Other Ambulatory Visit: Payer: Self-pay | Admitting: Family Medicine

## 2014-08-01 ENCOUNTER — Other Ambulatory Visit: Payer: Self-pay | Admitting: Family Medicine

## 2014-08-18 ENCOUNTER — Other Ambulatory Visit: Payer: Self-pay | Admitting: Family Medicine

## 2014-10-05 ENCOUNTER — Other Ambulatory Visit: Payer: Self-pay | Admitting: Family Medicine

## 2014-11-07 ENCOUNTER — Other Ambulatory Visit: Payer: Self-pay | Admitting: Family Medicine

## 2014-11-12 ENCOUNTER — Ambulatory Visit (INDEPENDENT_AMBULATORY_CARE_PROVIDER_SITE_OTHER): Payer: Medicare Other | Admitting: Family Medicine

## 2014-11-12 ENCOUNTER — Encounter: Payer: Self-pay | Admitting: Family Medicine

## 2014-11-12 VITALS — BP 130/78 | HR 76 | Temp 97.7°F | Wt 179.0 lb

## 2014-11-12 DIAGNOSIS — E785 Hyperlipidemia, unspecified: Secondary | ICD-10-CM

## 2014-11-12 DIAGNOSIS — F0390 Unspecified dementia without behavioral disturbance: Secondary | ICD-10-CM

## 2014-11-12 DIAGNOSIS — E038 Other specified hypothyroidism: Secondary | ICD-10-CM

## 2014-11-12 DIAGNOSIS — Z8659 Personal history of other mental and behavioral disorders: Secondary | ICD-10-CM

## 2014-11-12 NOTE — Progress Notes (Signed)
Pre visit review using our clinic review tool, if applicable. No additional management support is needed unless otherwise documented below in the visit note. 

## 2014-11-12 NOTE — Progress Notes (Signed)
   Subjective:    Patient ID: Sophia HillockJean H Ludwick, female    DOB: Aug 11, 1941, 73 y.o.   MRN: [id number]  HPI   Patient seen for medical follow-up. She has multiple problems including history of Alzheimer's dementia, hypothyroidism, hyperlipidemia , hypertension. She had some skin irritation with Exelon patch and stopped that. She does remain on Namenda. Symptomatically stable. No recent falls. Her depression seems stable. Husband states she has low motivation in general but this is unchanged. All medications reviewed. Compliant with all. They are using a pillbox.  Past Medical History  Diagnosis Date  . Arthritis   . Depression   . Glaucoma   . Allergy   . Thyroid disease   . Migraines   . Hyperlipidemia   . Osteopenia   . Dementia    Past Surgical History  Procedure Laterality Date  . Colon surgery  1995    cancer    reports that she has never smoked. She does not have any smokeless tobacco history on file. Her alcohol and drug histories are not on file. family history includes Alcohol abuse in her father; Cancer in her paternal grandmother. Allergies  Allergen Reactions  . Aricept [Donepezil Hcl] Itching      Review of Systems  Constitutional: Negative for fatigue.  Eyes: Negative for visual disturbance.  Respiratory: Negative for cough, chest tightness, shortness of breath and wheezing.   Cardiovascular: Negative for chest pain, palpitations and leg swelling.  Neurological: Negative for dizziness, seizures, syncope, weakness, light-headedness and headaches.       Objective:   Physical Exam  Constitutional: She appears well-developed and well-nourished.  Neck: Neck supple. No thyromegaly present.  Cardiovascular: Normal rate and regular rhythm.   Pulmonary/Chest: Effort normal and breath sounds normal. No respiratory distress. She has no wheezes. She has no rales.  Musculoskeletal:  Trace nonpitting edema lower legs bilaterally  Neurological: She is alert.  Psychiatric:  She has a normal mood and affect.          Assessment & Plan:  #1 history of dementia. Currently stable. She cannot tolerate Exelon. We discussed other medications such as Aricept but they're not interested at this time  #2 history of depression currently stable. Continue Pristiq and low-dose Abilify. #3 hypothyroidism. Recheck TSH at follow-up #4 hyperlipidemia. Check lipids at follow-up

## 2014-12-08 ENCOUNTER — Ambulatory Visit (INDEPENDENT_AMBULATORY_CARE_PROVIDER_SITE_OTHER): Payer: Medicare Other | Admitting: Family Medicine

## 2014-12-08 ENCOUNTER — Encounter: Payer: Self-pay | Admitting: Family Medicine

## 2014-12-08 ENCOUNTER — Other Ambulatory Visit: Payer: Self-pay | Admitting: Family Medicine

## 2014-12-08 VITALS — BP 130/80 | HR 68 | Temp 97.5°F | Ht 70.87 in | Wt 174.0 lb

## 2014-12-08 DIAGNOSIS — M8580 Other specified disorders of bone density and structure, unspecified site: Secondary | ICD-10-CM

## 2014-12-08 DIAGNOSIS — E039 Hypothyroidism, unspecified: Secondary | ICD-10-CM

## 2014-12-08 DIAGNOSIS — E785 Hyperlipidemia, unspecified: Secondary | ICD-10-CM

## 2014-12-08 DIAGNOSIS — E559 Vitamin D deficiency, unspecified: Secondary | ICD-10-CM

## 2014-12-08 DIAGNOSIS — Z0000 Encounter for general adult medical examination without abnormal findings: Secondary | ICD-10-CM

## 2014-12-08 DIAGNOSIS — Z1231 Encounter for screening mammogram for malignant neoplasm of breast: Secondary | ICD-10-CM

## 2014-12-08 LAB — BASIC METABOLIC PANEL
BUN: 22 mg/dL (ref 6–23)
CALCIUM: 9.3 mg/dL (ref 8.4–10.5)
CO2: 28 meq/L (ref 19–32)
Chloride: 107 mEq/L (ref 96–112)
Creatinine, Ser: 1.1 mg/dL (ref 0.4–1.2)
GFR: 53.77 mL/min — ABNORMAL LOW (ref >60.00)
GLUCOSE: 99 mg/dL (ref 70–99)
POTASSIUM: 4.2 meq/L (ref 3.5–5.1)
Sodium: 140 mEq/L (ref 135–145)

## 2014-12-08 LAB — HEPATIC FUNCTION PANEL
ALT: 20 U/L (ref 0–35)
AST: 29 U/L (ref 0–37)
Albumin: 4.5 g/dL (ref 3.5–5.2)
Alkaline Phosphatase: 80 U/L (ref 39–117)
BILIRUBIN TOTAL: 0.9 mg/dL (ref 0.2–1.2)
Bilirubin, Direct: 0.1 mg/dL (ref 0.0–0.3)
Total Protein: 7.1 g/dL (ref 6.0–8.3)

## 2014-12-08 LAB — LIPID PANEL
Cholesterol: 137 mg/dL (ref 0–200)
HDL: 40.9 mg/dL (ref >39.00)
LDL CALC: 59 mg/dL (ref 0–99)
NonHDL: 96.1
TRIGLYCERIDES: 184 mg/dL — AB (ref 0.0–149.0)
Total CHOL/HDL Ratio: 3
VLDL: 36.8 mg/dL (ref 0.0–40.0)

## 2014-12-08 LAB — VITAMIN D 25 HYDROXY (VIT D DEFICIENCY, FRACTURES): VITD: 63.06 ng/mL (ref 30.00–100.00)

## 2014-12-08 LAB — TSH: TSH: 1.76 u[IU]/mL (ref 0.35–4.50)

## 2014-12-08 NOTE — Patient Instructions (Signed)
Continue with yearly flu vaccine Your tetanus and other immunizations are up-to-date Set up repeat mammogram and bone density scan Continue daily calcium 1200 mg and vitamin D 1000 international units

## 2014-12-08 NOTE — Progress Notes (Signed)
Pre visit review using our clinic review tool, if applicable. No additional management support is needed unless otherwise documented below in the visit note. 

## 2014-12-08 NOTE — Progress Notes (Signed)
Subjective:    Patient ID: Sophia Wright, female    DOB: 01-04-1941, 74 y.o.   MRN: [id number]  HPI Patient for Medicare wellness exam and medical follow-up. She has medical problems including dementia, history of recurrent depression, osteopenia, osteoarthritis, vitamin D deficiency, hypothyroidism. Immunizations are all up-to-date. She has not had mammogram or DEXA scan in over 2 years. Colonoscopy up-to-date. She also has remote history of colon cancer. Last colonoscopy 2 years ago. She is due for lab work. No recent falls. Depression stable. All of her medications are reviewed. She has depression currently treated with 2 drug regimen. This is stable. She has dementia. No recent wandering behaviors or agitation. Hypothyroidism which is treated. No recent constipation issues. No cold intolerance. She has osteopenia and they cannot confirm whether she is taking regular calcium or vitamin D. No recent falls. Last DEXA scan over 2 years ago  Reviewed with no changes:   Past Medical History  Diagnosis Date  . Arthritis   . Depression   . Glaucoma   . Allergy   . Thyroid disease   . Migraines   . Hyperlipidemia   . Osteopenia   . Dementia    Past Surgical History  Procedure Laterality Date  . Colon surgery  1995    cancer    reports that she has never smoked. She does not have any smokeless tobacco history on file. Her alcohol and drug histories are not on file. family history includes Alcohol abuse in her father; Cancer in her paternal grandmother. Allergies  Allergen Reactions  . Aricept [Donepezil Hcl] Itching   1.  Risk factors based on Past Medical , Social, and Family history reviewed and as indicated above with no changes 2.  Limitations in physical activities None.  No recent falls. 3.  Depression/mood No active depression or anxiety issues at this time. She does have history of recurrent depression which is stable on 2 drug regimen 4.  Hearing No defiits 5.  ADLs  independent in all. 6.  Cognitive function (orientation to time and place, language, writing, speech,memory) she has fairly advanced dementia currently treated with Namenda. Previous intolerance with Aricept. 7.  Home Safety no issues 8.  Height, weight, and visual acuity.all stable. 9.  Counseling discussed continued calcium and vitamin D use and fall prevention 10. Recommendation of preventive services. Discussed pros and cons of DEXA scanning and repeat mammography and she wishes to proceed 11. Labs based on risk factors TSH, lipid, hepatic, basic metabolic panel, 25-hydroxy vitamin D 12. Care Plan as above 13. Other Providers Dr. Kinnie Scales, GI 14. Written schedule of screening/prevention services given to patient.    Review of Systems  Constitutional: Negative for fever, activity change, appetite change, fatigue and unexpected weight change.  HENT: Negative for ear pain, hearing loss, sore throat and trouble swallowing.   Eyes: Negative for visual disturbance.  Respiratory: Negative for cough and shortness of breath.   Cardiovascular: Negative for chest pain and palpitations.  Gastrointestinal: Negative for abdominal pain, diarrhea, constipation and blood in stool.  Genitourinary: Negative for dysuria and hematuria.  Musculoskeletal: Negative for myalgias, back pain and arthralgias.  Skin: Negative for rash.  Neurological: Negative for dizziness, syncope and headaches.  Hematological: Negative for adenopathy.  Psychiatric/Behavioral: Negative for confusion and dysphoric mood.       Objective:   Physical Exam  Constitutional: She appears well-developed and well-nourished.  HENT:  Head: Normocephalic and atraumatic.  Eyes: EOM are normal. Pupils are equal, round, and  reactive to light.  Neck: Normal range of motion. Neck supple. No thyromegaly present.  Cardiovascular: Normal rate, regular rhythm and normal heart sounds.   No murmur heard. Pulmonary/Chest: Breath sounds normal.  No respiratory distress. She has no wheezes. She has no rales.  Abdominal: Soft. Bowel sounds are normal. She exhibits no distension and no mass. There is no tenderness. There is no rebound and no guarding.  Musculoskeletal: Normal range of motion. She exhibits no edema.  Lymphadenopathy:    She has no cervical adenopathy.  Neurological: She is alert. She displays normal reflexes. No cranial nerve deficit.  She is oriented to person and place but not date  Skin: No rash noted.  Psychiatric: She has a normal mood and affect. Her behavior is normal. Judgment and thought content normal.          Assessment & Plan:  #1 health maintenance. Immunizations up-to-date. Fall prevention discussed. Set up repeat DEXA scan. We did not recommend further Pap smears. Set up repeat mammogram #2 hypothyroidism. Recheck TSH #3 hyperlipidemia. Check lipid and hepatic panel #4 history of osteopenia. History of low vitamin D. Recheck vitamin D level. Discussed adequate calcium and vitamin D and fall prevention

## 2014-12-09 ENCOUNTER — Telehealth: Payer: Self-pay | Admitting: Family Medicine

## 2014-12-09 NOTE — Telephone Encounter (Signed)
Faxed order to [facility]. 

## 2014-12-09 NOTE — Telephone Encounter (Signed)
Pt hus was inform to call [facility] center

## 2014-12-09 NOTE — Telephone Encounter (Signed)
Pt needs [facility] sent to [facility] for bone density and mammogram.

## 2014-12-09 NOTE — Telephone Encounter (Signed)
Correction pt goes to [facility]. Pt fax orders

## 2014-12-09 NOTE — Telephone Encounter (Signed)
The orders are already in the system.

## 2015-01-07 LAB — HM MAMMOGRAPHY: HM MAMMO: NORMAL

## 2015-01-24 ENCOUNTER — Encounter: Payer: Self-pay | Admitting: Family Medicine

## 2015-02-03 ENCOUNTER — Encounter: Payer: Self-pay | Admitting: Family Medicine

## 2015-02-03 ENCOUNTER — Ambulatory Visit (INDEPENDENT_AMBULATORY_CARE_PROVIDER_SITE_OTHER): Payer: Medicare Other | Admitting: Family Medicine

## 2015-02-03 DIAGNOSIS — R300 Dysuria: Secondary | ICD-10-CM

## 2015-02-03 LAB — POCT URINALYSIS DIPSTICK
Bilirubin, UA: NEGATIVE
Blood, UA: NEGATIVE
Glucose, UA: NEGATIVE
KETONES UA: NEGATIVE
Nitrite, UA: NEGATIVE
PH UA: 5
Protein, UA: NEGATIVE
SPEC GRAV UA: 1.02
UROBILINOGEN UA: 0.2

## 2015-02-03 MED ORDER — CEPHALEXIN 500 MG PO CAPS
500.0000 mg | ORAL_CAPSULE | Freq: Three times a day (TID) | ORAL | Status: DC
Start: 2015-02-03 — End: 2015-11-07

## 2015-02-03 NOTE — Patient Instructions (Signed)

## 2015-02-03 NOTE — Progress Notes (Signed)
Pre visit review using our clinic review tool, if applicable. No additional management support is needed unless otherwise documented below in the visit note. 

## 2015-02-03 NOTE — Progress Notes (Signed)
   Subjective:    Patient ID: Sophia Wright, female    DOB: 1941/11/27, 74 y.o.   MRN: [id number]  HPI Acute visit. Patient seen with dysuria. She has fairly advanced Alzheimer's dementia and history is difficult. According her caregiver and husband she apparently had some increased urgency and possibly some mild burning with urination past couple days. No gross hematuria. No fevers or chills. No complaints of back or abdominal pain.  Past Medical History  Diagnosis Date  . Arthritis   . Depression   . Glaucoma   . Allergy   . Thyroid disease   . Migraines   . Hyperlipidemia   . Osteopenia   . Dementia    Past Surgical History  Procedure Laterality Date  . Colon surgery  1995    cancer    reports that she has never smoked. She does not have any smokeless tobacco history on file. Her alcohol and drug histories are not on file. family history includes Alcohol abuse in her father; Cancer in her paternal grandmother. Allergies  Allergen Reactions  . Aricept [Donepezil Hcl] Itching      Review of Systems  Constitutional: Negative for fever, chills and appetite change.  Gastrointestinal: Negative for nausea, vomiting, abdominal pain, diarrhea and constipation.  Genitourinary: Positive for dysuria and frequency.  Musculoskeletal: Negative for back pain.  Neurological: Negative for dizziness.       Objective:   Physical Exam  Constitutional: She appears well-developed and well-nourished. No distress.  HENT:  Mouth/Throat: Oropharynx is clear and moist.  Cardiovascular: Normal rate and regular rhythm.   Pulmonary/Chest: Effort normal and breath sounds normal. No respiratory distress. She has no wheezes. She has no rales.  Neurological: She is alert.          Assessment & Plan:  Dysuria. Possible UTI. Urine dipstick reveals only trace leukocytes. Urine culture sent. Keflex 500 mg 3 times a day pending culture results. Stay well-hydrated.

## 2015-02-06 ENCOUNTER — Other Ambulatory Visit: Payer: Self-pay | Admitting: Family Medicine

## 2015-02-06 LAB — URINE CULTURE: Colony Count: 35000

## 2015-03-02 ENCOUNTER — Other Ambulatory Visit: Payer: Self-pay | Admitting: Family Medicine

## 2015-04-06 ENCOUNTER — Other Ambulatory Visit: Payer: Self-pay | Admitting: Family Medicine

## 2015-06-17 ENCOUNTER — Other Ambulatory Visit: Payer: Self-pay | Admitting: Family Medicine

## 2015-08-08 ENCOUNTER — Other Ambulatory Visit: Payer: Self-pay | Admitting: Family Medicine

## 2015-08-19 ENCOUNTER — Other Ambulatory Visit: Payer: Self-pay

## 2015-08-19 ENCOUNTER — Telehealth: Payer: Self-pay | Admitting: Family Medicine

## 2015-08-19 ENCOUNTER — Encounter: Payer: Self-pay | Admitting: Family Medicine

## 2015-08-19 ENCOUNTER — Ambulatory Visit (INDEPENDENT_AMBULATORY_CARE_PROVIDER_SITE_OTHER): Payer: Medicare Other | Admitting: Family Medicine

## 2015-08-19 VITALS — BP 122/82 | HR 69 | Temp 98.5°F | Ht 70.87 in | Wt 183.3 lb

## 2015-08-19 DIAGNOSIS — Z23 Encounter for immunization: Secondary | ICD-10-CM

## 2015-08-19 DIAGNOSIS — F0390 Unspecified dementia without behavioral disturbance: Secondary | ICD-10-CM

## 2015-08-19 MED ORDER — DESVENLAFAXINE SUCCINATE ER 50 MG PO TB24: 50.0000 mg | ORAL_TABLET | Freq: Every day | ORAL | Status: DC

## 2015-08-19 NOTE — Telephone Encounter (Signed)
Pt needs a refill on her Pristiq sent to [facility] at [facility] and [address] please.

## 2015-08-19 NOTE — Telephone Encounter (Signed)
rx was send to the pharmacy

## 2015-08-19 NOTE — Telephone Encounter (Signed)
Refill for one year OK.

## 2015-08-19 NOTE — Progress Notes (Signed)
Pre visit review using our clinic review tool, if applicable. No additional management support is needed unless otherwise documented below in the visit note. 

## 2015-08-19 NOTE — Progress Notes (Signed)
   Subjective:    Patient ID: Sophia Wright, female    DOB: Apr 22, 1941, 74 y.o.   MRN: [id number]  HPI Patient seen for follow-up regarding dementia. She has fairly advanced Alzheimer's dementia. She has not had any major behavioral disturbance but husband states she is getting more more disoriented. He does have a caregiver that comes in occasionally. Medications reviewed. She remains on Namenda 10 mg twice daily. She is taking Abilify 2 mg at night. She is sleeping fairly well. Other medications reviewed. Hypothyroidism on levothyroxine. She had labs done in January which were normal..  Past Medical History  Diagnosis Date  . Arthritis   . Depression   . Glaucoma   . Allergy   . Thyroid disease   . Migraines   . Hyperlipidemia   . Osteopenia   . Dementia    Past Surgical History  Procedure Laterality Date  . Colon surgery  1995    cancer    reports that she has never smoked. She does not have any smokeless tobacco history on file. Her alcohol and drug histories are not on file. family history includes Alcohol abuse in her father; Cancer in her paternal grandmother. Allergies  Allergen Reactions  . Aricept [Donepezil Hcl] Itching      Review of Systems  Constitutional: Negative for appetite change and unexpected weight change.  Respiratory: Negative for cough and shortness of breath.   Cardiovascular: Negative for chest pain.  Psychiatric/Behavioral: Positive for confusion. Negative for agitation.       Objective:   Physical Exam  Constitutional: She appears well-developed and well-nourished.  Cardiovascular: Normal rate and regular rhythm.   Pulmonary/Chest: Effort normal and breath sounds normal. No respiratory distress. She has no wheezes. She has no rales.  Musculoskeletal: She exhibits no edema.  Neurological: She is alert. No cranial nerve deficit. Coordination normal.  Not oriented to time, date          Assessment & Plan:  Advanced dementia. Repeat  MMSE.  She scored 14/30.  She has frequent disorientation but no significant agitation. Continue Abilify at night. Flu vaccine given. Routine follow-up 4 months repeat labs then including TSH

## 2015-08-29 ENCOUNTER — Other Ambulatory Visit: Payer: Self-pay | Admitting: Family Medicine

## 2015-09-06 ENCOUNTER — Ambulatory Visit (INDEPENDENT_AMBULATORY_CARE_PROVIDER_SITE_OTHER): Payer: Medicare Other

## 2015-09-06 ENCOUNTER — Ambulatory Visit (INDEPENDENT_AMBULATORY_CARE_PROVIDER_SITE_OTHER): Payer: Medicare Other | Admitting: Emergency Medicine

## 2015-09-06 VITALS — BP 128/72 | HR 78 | Temp 97.8°F | Resp 18 | Ht 72.0 in | Wt 182.0 lb

## 2015-09-06 DIAGNOSIS — M25571 Pain in right ankle and joints of right foot: Secondary | ICD-10-CM

## 2015-09-06 DIAGNOSIS — S92301A Fracture of unspecified metatarsal bone(s), right foot, initial encounter for closed fracture: Secondary | ICD-10-CM | POA: Diagnosis not present

## 2015-09-06 NOTE — Patient Instructions (Signed)
Metatarsal Fracture, Undisplaced  A metatarsal fracture is a break in the bone(s) of the foot. These are the bones of the foot that connect your toes to the bones of the ankle.  DIAGNOSIS   The diagnoses of these fractures are usually made with X-rays. If there are problems in the forefoot and x-rays are normal a later bone scan will usually make the diagnosis.   TREATMENT AND HOME CARE INSTRUCTIONS  · Treatment may or may not include a cast or walking shoe. When casts are needed the use is usually for short periods of time so as not to slow down healing with muscle wasting (atrophy).  · Activities should be stopped until further advised by your caregiver.  · Wear shoes with adequate shock absorbing capabilities and stiff soles.  · Alternative exercise may be undertaken while waiting for healing. These may include bicycling and swimming, or as your caregiver suggests.  · It is important to keep all follow-up visits or specialty referrals. The failure to keep these appointments could result in improper bone healing and chronic pain or disability.  · Warning: Do not drive a car or operate a motor vehicle until your caregiver specifically tells you it is safe to do so.  IF YOU DO NOT HAVE A CAST OR SPLINT:  · You may walk on your injured foot as tolerated or advised.  · Do not put any weight on your injured foot for as long as directed by your caregiver. Slowly increase the amount of time you walk on the foot as the pain allows or as advised.  · Use crutches until you can bear weight without pain. A gradual increase in weight bearing may help.  · Apply ice to the injury for 15-20 minutes each hour while awake for the first 2 days. Put the ice in a plastic bag and place a towel between the bag of ice and your skin.  · Only take over-the-counter or prescription medicines for pain, discomfort, or fever as directed by your caregiver.  SEEK IMMEDIATE MEDICAL CARE IF:   · Your cast gets damaged or breaks.  · You have  continued severe pain or more swelling than you did before the cast was put on, or the pain is not controlled with medications.  · Your skin or nails below the injury turn blue or grey, or feel cold or numb.  · There is a bad smell, or new stains or pus-like (purulent) drainage coming from the cast.  MAKE SURE YOU:   · Understand these instructions.  · Will watch your condition.  · Will get help right away if you are not doing well or get worse.  Document Released: 08/11/2002 Document Revised: 02/11/2012 Document Reviewed: 07/02/2008  ExitCare® Patient Information ©2015 [employer]. This information is not intended to replace advice given to you by your health care provider. Make sure you discuss any questions you have with your health care provider.

## 2015-09-06 NOTE — Progress Notes (Signed)
Subjective:  Patient ID: Sophia Wright, female    DOB: 08-17-1941  Age: 74 y.o. MRN: [id number]  CC: Ankle Injury   HPI REN GRASSE presents  with an injury to her right foot that happened yesterday when she had an inversion injury. She didn't fall to ground. Has pain and swelling in her lateral foot pain in her ankle. She has no deformity or ecchymosis. She is unable to bear weight comfortably on her foot  History Jewelz has a past medical history of Arthritis; Depression; Glaucoma; Allergy; Thyroid disease; Migraines; Hyperlipidemia; Osteopenia; and Dementia.   She has past surgical history that includes Colon surgery (1995) and Breast surgery.   Her  family history includes Alcohol abuse in her father; Cancer in her paternal grandmother.  She   reports that she has never smoked. She does not have any smokeless tobacco history on file. Her alcohol and drug histories are not on file.  Outpatient Prescriptions Prior to Visit  Medication Sig Dispense Refill  . ARIPiprazole (ABILIFY) 2 MG tablet TAKE 1 TABLET BY MOUTH DAILY 30 tablet 0  . cetirizine (ZYRTEC) 10 MG tablet Take 10 mg by mouth daily.    Marland Kitchen desvenlafaxine (PRISTIQ) 50 MG 24 hr tablet Take 1 tablet (50 mg total) by mouth daily. 90 tablet 2  . levothyroxine (SYNTHROID, LEVOTHROID) 88 MCG tablet TAKE 1 TABLET BY MOUTH DAILY 90 tablet 1  . memantine (NAMENDA) 10 MG tablet TAKE 1 TABLET BY MOUTH TWICE DAILY 60 tablet 3  . metoprolol tartrate (LOPRESSOR) 25 MG tablet TAKE 1/2 TABLET BY MOUTH TWICE DAILY 90 tablet 2  . naproxen sodium (ANAPROX) 220 MG tablet Take 220 mg by mouth as needed.    . simvastatin (ZOCOR) 20 MG tablet TAKE 1 TABLET BY MOUTH EVERY NIGHT AT BEDTIME 90 tablet 2  . TRAVATAN Z 0.004 % ophthalmic solution Place 1 drop into both eyes at bedtime.     . Vitamin D, Ergocalciferol, (DRISDOL) 50000 UNITS CAPS capsule TAKE 1 CAPSULE BY MOUTH EVERY WEEK 16 capsule 2  . cephALEXin (KEFLEX) 500 MG capsule Take 1  capsule (500 mg total) by mouth 3 (three) times daily. (Patient not taking: Reported on 09/06/2015) 21 capsule 0   No facility-administered medications prior to visit.    Social History   Social History  . Marital Status: Married    Spouse Name: N/A  . Number of Children: N/A  . Years of Education: N/A   Social History Main Topics  . Smoking status: Never Smoker   . Smokeless tobacco: None  . Alcohol Use: None  . Drug Use: None  . Sexual Activity: Not Asked   Other Topics Concern  . None   Social History Narrative     Review of Systems  Constitutional: Negative for fever, chills and appetite change.  HENT: Negative for congestion, ear pain, postnasal drip, sinus pressure and sore throat.   Eyes: Negative for pain and redness.  Respiratory: Negative for cough, shortness of breath and wheezing.   Cardiovascular: Negative for leg swelling.  Gastrointestinal: Negative for nausea, vomiting, abdominal pain, diarrhea, constipation and blood in stool.  Endocrine: Negative for polyuria.  Genitourinary: Negative for dysuria, urgency, frequency and flank pain.  Musculoskeletal: Negative for gait problem.  Skin: Negative for rash.  Neurological: Negative for weakness and headaches.  Psychiatric/Behavioral: Negative for confusion and decreased concentration. The patient is not nervous/anxious.     Objective:  BP 128/72 mmHg  Pulse 78  Temp(Src) 97.8 F (36.6  C) (Oral)  Resp 18  Ht 6' (1.829 m)  Wt 182 lb (82.555 kg)  BMI 24.68 kg/m2  SpO2 97%  Physical Exam  Constitutional: She is oriented to person, place, and time. She appears well-developed and well-nourished. No distress.  HENT:  Head: Normocephalic and atraumatic.  Right Ear: External ear normal.  Left Ear: External ear normal.  Nose: Nose normal.  Eyes: Conjunctivae and EOM are normal. Pupils are equal, round, and reactive to light. No scleral icterus.  Neck: Normal range of motion. Neck supple. No tracheal  deviation present.  Cardiovascular: Normal rate, regular rhythm and normal heart sounds.   Pulmonary/Chest: Effort normal. No respiratory distress. She has no wheezes. She has no rales.  Abdominal: She exhibits no mass. There is no tenderness. There is no rebound and no guarding.  Musculoskeletal: She exhibits no edema.       Right foot: There is tenderness and swelling.  Lymphadenopathy:    She has no cervical adenopathy.  Neurological: She is alert and oriented to person, place, and time. Coordination normal.  Skin: Skin is warm and dry. No rash noted.  Psychiatric: She has a normal mood and affect. Her behavior is normal.      Assessment & Plan:   Zhoey was seen today for ankle injury.  Diagnoses and all orders for this visit:  Pain in joint, ankle and foot, right -     DG Ankle Complete Right; Future -     DG Foot Complete Right; Future  Closed fracture of metatarsal bone, right, initial encounter -     Ambulatory referral to Orthopedic Surgery   I am having Ms. Herrada maintain her TRAVATAN Z, cetirizine, naproxen sodium, cephALEXin, metoprolol tartrate, simvastatin, Vitamin D (Ergocalciferol), levothyroxine, memantine, desvenlafaxine, and ARIPiprazole.  No orders of the defined types were placed in this encounter.    Appropriate red flag conditions were discussed with the patient as well as actions that should be taken.  Patient expressed his understanding.  Follow-up: Return if symptoms worsen or fail to improve.  Carmelina Dane, MD   [facility] reading (PRIMARY) by  Dr. Dareen Piano fracture fifth metatarsal.

## 2015-09-27 ENCOUNTER — Other Ambulatory Visit: Payer: Self-pay | Admitting: Family Medicine

## 2015-09-27 NOTE — Telephone Encounter (Signed)
Pt last visit 08/19/15 and last Rx refill 08/29/15 #30

## 2015-09-27 NOTE — Telephone Encounter (Signed)
Refill for one year 

## 2015-10-10 ENCOUNTER — Other Ambulatory Visit: Payer: Self-pay | Admitting: Family Medicine

## 2015-11-07 ENCOUNTER — Encounter: Payer: Self-pay | Admitting: Family Medicine

## 2015-11-07 ENCOUNTER — Ambulatory Visit (INDEPENDENT_AMBULATORY_CARE_PROVIDER_SITE_OTHER): Payer: Medicare Other | Admitting: Family Medicine

## 2015-11-07 VITALS — BP 120/78 | HR 102 | Temp 98.0°F | Ht 72.0 in | Wt 183.9 lb

## 2015-11-07 DIAGNOSIS — J069 Acute upper respiratory infection, unspecified: Secondary | ICD-10-CM | POA: Diagnosis not present

## 2015-11-07 MED ORDER — BENZONATATE 100 MG PO CAPS: 100.0000 mg | ORAL_CAPSULE | Freq: Three times a day (TID) | ORAL | Status: DC | PRN

## 2015-11-07 NOTE — Progress Notes (Signed)
Pre visit review using our clinic review tool, if applicable. No additional management support is needed unless otherwise documented below in the visit note. 

## 2015-11-07 NOTE — Patient Instructions (Signed)

## 2015-11-07 NOTE — Progress Notes (Signed)
HPI:  Acute visit for:  URI: -started: 3 days ago -symptoms:nasal congestion, PND< sore throat, cough -denies:fever, SOB, NVD, tooth pain -has tried: claritin as has allergies -sick contacts/travel/risks: denies flu exposure, tick exposure or or Ebola risks -Hx of: allergies - take claritin and flonase sometimes ROS: See pertinent positives and negatives per HPI.  Past Medical History  Diagnosis Date  . Arthritis   . Depression   . Glaucoma   . Allergy   . Thyroid disease   . Migraines   . Hyperlipidemia   . Osteopenia   . Dementia     Past Surgical History  Procedure Laterality Date  . Colon surgery  1995    cancer  . Breast surgery      Family History  Problem Relation Age of Onset  . Alcohol abuse Father   . Cancer Paternal Grandmother     colon    Social History   Social History  . Marital Status: Married    Spouse Name: N/A  . Number of Children: N/A  . Years of Education: N/A   Social History Main Topics  . Smoking status: Never Smoker   . Smokeless tobacco: None  . Alcohol Use: None  . Drug Use: None  . Sexual Activity: Not Asked   Other Topics Concern  . None   Social History Narrative     Current outpatient prescriptions:  .  ARIPiprazole (ABILIFY) 2 MG tablet, TAKE 1 TABLET BY MOUTH DAILY, Disp: 90 tablet, Rfl: 0 .  Cetirizine-Pseudoephedrine (ZYRTEC-D PO), Take by mouth., Disp: , Rfl:  .  desvenlafaxine (PRISTIQ) 50 MG 24 hr tablet, Take 1 tablet (50 mg total) by mouth daily., Disp: 90 tablet, Rfl: 2 .  levothyroxine (SYNTHROID, LEVOTHROID) 88 MCG tablet, TAKE 1 TABLET BY MOUTH DAILY, Disp: 90 tablet, Rfl: 0 .  memantine (NAMENDA) 10 MG tablet, TAKE 1 TABLET BY MOUTH TWICE DAILY, Disp: 60 tablet, Rfl: 3 .  metoprolol tartrate (LOPRESSOR) 25 MG tablet, TAKE 1/2 TABLET BY MOUTH TWICE DAILY, Disp: 90 tablet, Rfl: 2 .  naproxen sodium (ANAPROX) 220 MG tablet, Take 220 mg by mouth as needed., Disp: , Rfl:  .  simvastatin (ZOCOR) 20 MG  tablet, TAKE 1 TABLET BY MOUTH EVERY NIGHT AT BEDTIME, Disp: 90 tablet, Rfl: 2 .  TRAVATAN Z 0.004 % ophthalmic solution, Place 1 drop into both eyes at bedtime. , Disp: , Rfl:  .  Vitamin D, Ergocalciferol, (DRISDOL) 50000 UNITS CAPS capsule, TAKE 1 CAPSULE BY MOUTH EVERY WEEK, Disp: 16 capsule, Rfl: 2 .  benzonatate (TESSALON PERLES) 100 MG capsule, Take 1 capsule (100 mg total) by mouth 3 (three) times daily as needed for cough., Disp: 20 capsule, Rfl: 0  EXAM:  Filed Vitals:   11/07/15 1617  BP: 120/78  Pulse: 102  Temp: 98 F (36.7 C)    Body mass index is 24.94 kg/(m^2).  GENERAL: vitals reviewed and listed above, alert, oriented, appears well hydrated and in no acute distress  HEENT: atraumatic, conjunttiva clear, no obvious abnormalities on inspection of external nose and ears, normal appearance of ear canals and TMs, clear nasal congestion, mild post oropharyngeal erythema with PND, no tonsillar edema or exudate, no sinus TTP  NECK: no obvious masses on inspection  LUNGS: clear to auscultation bilaterally, no wheezes, rales or rhonchi, good air movement  CV: HRRR, no peripheral edema  MS: moves all extremities without noticeable abnormality  PSYCH: pleasant and cooperative, no obvious depression or anxiety  ASSESSMENT AND PLAN:  Discussed the following assessment and plan:  Upper respiratory infection  -given HPI and exam findings today, a serious infection or illness is unlikely. We discussed potential etiologies, with VURI being most likely, and advised supportive care and monitoring. We discussed treatment side effects, likely course, antibiotic misuse, transmission, and signs of developing a serious illness. -of course, we advised to return or notify a doctor immediately if symptoms worsen or persist or new concerns arise.    Patient Instructions  INSTRUCTIONS FOR UPPER RESPIRATORY INFECTION:  -plenty of rest and fluids  -nasal saline wash 2-3 times daily  (use prepackaged nasal saline or bottled/distilled water if making your own)   -can use AFRIN nasal spray for drainage and nasal congestion - but do NOT use longer then 3-4 days  -can use tylenol (in no history of liver disease) or ibuprofen (if no history of kidney disease, bowel bleeding or significant heart disease) as directed for aches and sorethroat  -in the winter time, using a humidifier at night is helpful (please follow cleaning instructions)  -if you are taking a cough medication - use only as directed, may also try a teaspoon of honey to coat the throat and throat lozenges. If given a cough medication with codeine or hydrocodone or other narcotic please be advised that this contains a strong and  potentially addicting medication. Please follow instructions carefully, take as little as possible and only use AS NEEDED for severe cough. Discuss potential side effects with your pharmacy. Please do not drive or operate machinery while taking these types of medications. Please do not take other sedating medications, drugs or alcohol while taking this medication without discussing with your doctor.  -for sore throat, salt water gargles can help  -follow up if you have fevers, facial pain, tooth pain, difficulty breathing or are worsening or symptoms persist longer then expected  Upper Respiratory Infection, Adult An upper respiratory infection (URI) is also known as the common cold. It is often caused by a type of germ (virus). Colds are easily spread (contagious). You can pass it to others by kissing, coughing, sneezing, or drinking out of the same glass. Usually, you get better in 1 to 3  weeks.  However, the cough can last for even longer. HOME CARE   Only take medicine as told by your doctor. Follow instructions provided above.  Drink enough water and fluids to keep your pee (urine) clear or pale yellow.  Get plenty of rest.  Return to work when your temperature is < 100 for 24 hours  or as told by your doctor. You may use a face mask and wash your hands to stop your cold from spreading. GET HELP RIGHT AWAY IF:   After the first few days, you feel you are getting worse.  You have questions about your medicine.  You have chills, shortness of breath, or red spit (mucus).  You have pain in the face for more then 1-2 days, especially when you bend forward.  You have a fever, puffy (swollen) neck, pain when you swallow, or white spots in the back of your throat.  You have a bad headache, ear pain, sinus pain, or chest pain.  You have a high-pitched whistling sound when you breathe in and out (wheezing).  You cough up blood.  You have sore muscles or a stiff neck. MAKE SURE YOU:   Understand these instructions.  Will watch your condition.  Will get help right away if you are not doing well or get  worse. Document Released: 05/07/2008 Document Revised: 02/11/2012 Document Reviewed: 02/24/2014 [facility] Patient Information 2015 [place], [place]. This information is not intended to replace advice given to you by your health care provider. Make sure you discuss any questions you have with your health care provider.      Kriste Basque R.

## 2015-11-10 ENCOUNTER — Other Ambulatory Visit: Payer: Self-pay | Admitting: Family Medicine

## 2015-11-14 ENCOUNTER — Ambulatory Visit (INDEPENDENT_AMBULATORY_CARE_PROVIDER_SITE_OTHER): Payer: Medicare Other | Admitting: Family Medicine

## 2015-11-14 ENCOUNTER — Encounter: Payer: Self-pay | Admitting: Family Medicine

## 2015-11-14 VITALS — BP 122/88 | HR 83 | Temp 98.4°F | Resp 16 | Ht 72.0 in | Wt 183.6 lb

## 2015-11-14 DIAGNOSIS — F0391 Unspecified dementia with behavioral disturbance: Secondary | ICD-10-CM

## 2015-11-14 DIAGNOSIS — R05 Cough: Secondary | ICD-10-CM

## 2015-11-14 DIAGNOSIS — R059 Cough, unspecified: Secondary | ICD-10-CM

## 2015-11-14 MED ORDER — CITALOPRAM HYDROBROMIDE 20 MG PO TABS: 20.0000 mg | ORAL_TABLET | Freq: Every day | ORAL | Status: DC

## 2015-11-14 MED ORDER — CEFUROXIME AXETIL 250 MG PO TABS: 250.0000 mg | ORAL_TABLET | Freq: Two times a day (BID) | ORAL | Status: DC

## 2015-11-14 NOTE — Progress Notes (Signed)
   Subjective:    Patient ID: Sophia Wright, female    DOB: 07/12/1941, 74 y.o.   MRN: [id number]  HPI Patient has Alzheimer's dementia. She's had this for several years Recent progressive behavioral disturbance. She started to wander more at night and 2 times in the past week has gotten up and left the house at night Frequent daytime napping. Past history of depression. She remains on Pristiq and also takes Abilify 2 mg at night. No reported fever. She's not seemed to be in any pain. She did have recent right foot fracture and has follow-up with orthopedics tomorrow.  Hypothyroidism on levothyroxin. She will be due for repeat labs next month. Other medications reviewed. No recent changes in environment. They have nurse that comes out twice per week  Husband also relates she's had over 2 weeks of productive cough. No definite fevers. No wheezing. No dyspnea.  Past Medical History  Diagnosis Date  . Arthritis   . Depression   . Glaucoma   . Allergy   . Thyroid disease   . Migraines   . Hyperlipidemia   . Osteopenia   . Dementia    Past Surgical History  Procedure Laterality Date  . Colon surgery  1995    cancer  . Breast surgery      reports that she has never smoked. She does not have any smokeless tobacco history on file. Her alcohol and drug histories are not on file. family history includes Alcohol abuse in her father; Cancer in her paternal grandmother. Allergies  Allergen Reactions  . Aricept [Donepezil Hcl] Itching      Review of Systems  Constitutional: Negative for fatigue.       Review of systems not reliable from patient because of her advanced dementia. Obtained from husband  Eyes: Negative for visual disturbance.  Respiratory: Negative for cough, chest tightness, shortness of breath and wheezing.   Cardiovascular: Negative for chest pain, palpitations and leg swelling.  Endocrine: Negative for polydipsia and polyuria.  Genitourinary: Negative for  dysuria.  Neurological: Negative for dizziness, seizures, syncope, weakness, light-headedness and headaches.  Psychiatric/Behavioral: Positive for confusion, sleep disturbance and agitation.       Objective:   Physical Exam  Constitutional: She appears well-developed and well-nourished.  HENT:  Right Ear: External ear normal.  Left Ear: External ear normal.  Mouth/Throat: Oropharynx is clear and moist.  Neck: Neck supple.  Cardiovascular: Normal rate and regular rhythm.   Pulmonary/Chest: Breath sounds normal. No respiratory distress. She has no wheezes. She has no rales.  Musculoskeletal: She exhibits no edema.  Neurological: She is alert.          Assessment & Plan:   #1 Alzheimer's disease with recent behavioral disturbance. We discussed nonpharmacologic measures to help calm her. Handout given. Change from Pristiq to citalopram 20 mg once daily which has some studies in Alzheimer's dementia. Increase Abilify to 4 mg daily at bedtime. Try to avoid benzodiazepines if at all possible because of fall risk Try to keep her engaged her in the day to avoid daytime naps.  Reassess 3-4 weeks.  Over 25 minutes spent with patient of which > 50% of time in direct counseling with husband regarding safety issues, medication issues, non-pharmacologic ways to manage her behavior. Look for triggers for delirium or agitation.   #2 persistent cough. Ceftin 250 mg twice a day for 7 days

## 2015-11-14 NOTE — Patient Instructions (Signed)
STOP the Pristiq START Citalopram 20 mg once daily INCREASE Abilify 2 mg to TWO at night. Try to keep her engaged during the day and avoid daytime naps as much as possible.        Alzheimer Disease Caregiver Guide Alzheimer disease is an illness that affects a person's brain. It causes a person to lose the ability to remember things and make good decisions. As the disease progresses, the person is unable to take care of himself or herself and needs more and more help to do simple tasks. Taking care of someone with Alzheimer disease can be very challenging and overwhelming.  MEMORY LOSS AND CONFUSION Memory loss and confusion is mild in the beginning stages of the disease. Both of these problems become more severe as the disease progresses. Eventually, the person will not recognize places or even close family members and friends.   Stay calm.  Respond with a short explanation. Long explanations can be overwhelming and confusing.  Avoid corrections that sound like scolding.  Try not to take it personally, even if the person forgets your name. BEHAVIOR CHANGES Behavior changes are part of the disease. The person may develop depression, anxiety, anger, hallucinations, or other behavior changes. These changes can come on suddenly and may be in response to pain, infection, changes in the environment (temperature, noise), overstimulation, or feeling lost or scared.   Try not to take behavior changes personally.  Remain calm and patient.  Do not argue or try to convince the person about a specific point. This will only make him or her more agitated.  Know that the behavior changes are part of the disease process and try to work through it. TIPS TO REDUCE FRUSTRATION  Schedule wisely by making appointments and doing daily tasks, like bathing and dressing, when the person is at his or her best.  Take your time. Simple tasks may take a lot longer, so be sure to allow for plenty of  time.  Limit choices. Too many choices can be overwhelming and stressful for the person.  Involve the person in what you are doing.  Stick to a routine.  Avoid new or crowded situations, if possible.  Use simple words, short sentences, and a calm voice. Only give one direction at a time.  Buy clothes and shoes that are easy to put on and take off.  Let people help if they offer. HOME SAFETY Keeping the home safe is very important to reduce the risk of falls and injuries.   Keep floors clear of clutter. Remove rugs, magazine racks, and floor lamps.  Keep hallways well lit.  Put a handrail and nonslip mat in the bathtub or shower.  Put childproof locks on cabinets with dangerous items, such as medicine, alcohol, guns, toxic cleaning items, sharp tools or utensils, matches, or lighters.  Place locks on doors where the person cannot easily see or reach them. This helps ensure that the person cannot wander out of the house and get lost.  Be prepared for emergencies. Keep a list of emergency phone numbers and addresses in a convenient area. PLANS FOR THE FUTURE  Do not put off talking about finances.  Talk about money management. People with Alzheimer disease have trouble managing their money as the disease gets worse.  Get help from professional advisors regarding financial and legal matters.  Do not put off talking about future care.  Choose a power of attorney. This is someone who can make decisions for the person with Alzheimer  disease when he or she is no longer able to do so.  Talk about driving and when it is the right time to stop. The person's health care provider can help give advice on this matter.  Talk about the person's living situation. If he or she lives alone, you need to make sure he or she is safe. Some people need extra help at home, and others need more care at a nursing home or care center. SUPPORT GROUPS Joining a support group can be very helpful for  caregivers of people with Alzheimer disease. Some advantages to being part of a support group include:   Getting strategies to manage stress.  Sharing experiences with others.  Receiving emotional comfort and support.  Learning new caregiving skills as the disease progresses.  Knowing what community resources are available and taking advantage of them. SEEK MEDICAL CARE IF:  The person has a fever.  The person has a sudden change in behavior that does not improve with calming strategies.  The person is unable to manage in his or her current living situation.  The person threatens you or anyone else, including himself or herself.  You are no longer able to care for the person.   This information is not intended to replace advice given to you by your health care provider. Make sure you discuss any questions you have with your health care provider.   Document Released: 07/31/2004 Document Revised: 12/10/2014 Document Reviewed: 12/26/2011 Elsevier Interactive Patient Education [employer]2016 [employer]

## 2015-12-05 ENCOUNTER — Other Ambulatory Visit: Payer: Self-pay | Admitting: Family Medicine

## 2015-12-06 MED ORDER — ARIPIPRAZOLE 2 MG PO TABS: ORAL_TABLET | ORAL | Status: DC

## 2015-12-06 NOTE — Telephone Encounter (Signed)
Rx sent to pharmacy with new directions from office visit on 12.2016.

## 2015-12-12 ENCOUNTER — Other Ambulatory Visit: Payer: Self-pay | Admitting: Family Medicine

## 2015-12-14 ENCOUNTER — Ambulatory Visit (INDEPENDENT_AMBULATORY_CARE_PROVIDER_SITE_OTHER): Payer: Medicare Other | Admitting: Family Medicine

## 2015-12-14 ENCOUNTER — Telehealth: Payer: Self-pay | Admitting: Family Medicine

## 2015-12-14 VITALS — BP 138/80 | HR 71 | Temp 97.8°F | Ht 72.0 in | Wt 184.2 lb

## 2015-12-14 DIAGNOSIS — F0391 Unspecified dementia with behavioral disturbance: Secondary | ICD-10-CM | POA: Diagnosis not present

## 2015-12-14 NOTE — Telephone Encounter (Signed)
Pt care giver call to say pt insurance company will not pay for ARIPiprazole (ABILIFY) 2 MG tablet.  Pt husband will contact insurance company and find out what they will pay for.

## 2015-12-14 NOTE — Progress Notes (Signed)
   Subjective:    Patient ID: Sophia HillockJean H Perrone, female    DOB: Nov 26, 1941, 75 y.o.   MRN: [id number]  HPI Follow-up dementia with some recent behavioral disturbance She is accompanied today by a sitter. Last visit her husband stated that she was having some agitation and one ring at night. We increased her Abilify to 4 mg at night and change her from Pristiq to citalopram  Caregiver states that she's been less agitated at night. She seems pleasant during the day. Denies any side effects from medication. There is some concern about whether her insurance will cover the increased dosage of Abilify.  Past Medical History  Diagnosis Date  . Arthritis   . Depression   . Glaucoma   . Allergy   . Thyroid disease   . Migraines   . Hyperlipidemia   . Osteopenia   . Dementia    Past Surgical History  Procedure Laterality Date  . Colon surgery  1995    cancer  . Breast surgery      reports that she has never smoked. She does not have any smokeless tobacco history on file. Her alcohol and drug histories are not on file. family history includes Alcohol abuse in her father; Cancer in her paternal grandmother. Allergies  Allergen Reactions  . Aricept [Donepezil Hcl] Itching      Review of Systems  Unable to perform ROS: Dementia  Constitutional: Negative for appetite change and unexpected weight change.       Objective:   Physical Exam  Constitutional: She appears well-developed and well-nourished.  Cardiovascular: Normal rate and regular rhythm.   Pulmonary/Chest: Effort normal and breath sounds normal. No respiratory distress. She has no wheezes. She has no rales.  Neurological: She is alert.  Alert and cooperative. No focal weakness          Assessment & Plan:  Dementia with some recent behavioral disturbance. Apparently according to caregiver this is improved with increase of Abilify and change to citalopram. Continue current regimen. Reassess 3 months. Husband will notify us  if they're unable to cover the Abilify

## 2015-12-22 ENCOUNTER — Encounter: Payer: Self-pay | Admitting: Family Medicine

## 2015-12-22 NOTE — Telephone Encounter (Signed)
Will you write an appeals letter for this pt stating that she specifically needs this medication?

## 2015-12-22 NOTE — Telephone Encounter (Signed)
Letter done

## 2015-12-23 NOTE — Telephone Encounter (Signed)
Autumn can you close this please .Marland Kitchen

## 2015-12-23 NOTE — Telephone Encounter (Signed)
Letter has been faxed to [employer]. Will call caregiver back after 10 to give her an update.

## 2015-12-26 ENCOUNTER — Telehealth: Payer: Self-pay | Admitting: Family Medicine

## 2015-12-26 NOTE — Telephone Encounter (Signed)
error 

## 2015-12-28 ENCOUNTER — Other Ambulatory Visit: Payer: Self-pay | Admitting: Family Medicine

## 2015-12-31 ENCOUNTER — Emergency Department (HOSPITAL_COMMUNITY)
Admission: EM | Admit: 2015-12-31 | Discharge: 2015-12-31 | Disposition: A | Discharge status: Home / Self Care | Payer: Medicare Other | Attending: Emergency Medicine | Admitting: Emergency Medicine

## 2015-12-31 ENCOUNTER — Emergency Department (HOSPITAL_COMMUNITY): Payer: Medicare Other

## 2015-12-31 ENCOUNTER — Encounter (HOSPITAL_COMMUNITY): Payer: Self-pay

## 2015-12-31 DIAGNOSIS — G43909 Migraine, unspecified, not intractable, without status migrainosus: Secondary | ICD-10-CM | POA: Diagnosis not present

## 2015-12-31 DIAGNOSIS — E785 Hyperlipidemia, unspecified: Secondary | ICD-10-CM | POA: Diagnosis not present

## 2015-12-31 DIAGNOSIS — E079 Disorder of thyroid, unspecified: Secondary | ICD-10-CM | POA: Diagnosis not present

## 2015-12-31 DIAGNOSIS — I959 Hypotension, unspecified: Secondary | ICD-10-CM | POA: Insufficient documentation

## 2015-12-31 DIAGNOSIS — Z79899 Other long term (current) drug therapy: Secondary | ICD-10-CM | POA: Diagnosis not present

## 2015-12-31 DIAGNOSIS — F329 Major depressive disorder, single episode, unspecified: Secondary | ICD-10-CM | POA: Insufficient documentation

## 2015-12-31 DIAGNOSIS — M8580 Other specified disorders of bone density and structure, unspecified site: Secondary | ICD-10-CM | POA: Insufficient documentation

## 2015-12-31 DIAGNOSIS — H409 Unspecified glaucoma: Secondary | ICD-10-CM | POA: Insufficient documentation

## 2015-12-31 DIAGNOSIS — M1990 Unspecified osteoarthritis, unspecified site: Secondary | ICD-10-CM | POA: Diagnosis not present

## 2015-12-31 DIAGNOSIS — F0390 Unspecified dementia without behavioral disturbance: Secondary | ICD-10-CM | POA: Insufficient documentation

## 2015-12-31 LAB — CBC WITH DIFFERENTIAL/PLATELET
BASOS ABS: 0 10*3/uL (ref 0.0–0.1)
Basophils Relative: 1 %
EOS PCT: 1 %
Eosinophils Absolute: 0.1 10*3/uL (ref 0.0–0.7)
HCT: 37.2 % (ref 36.0–46.0)
Hemoglobin: 12.3 g/dL (ref 12.0–15.0)
LYMPHS PCT: 21 %
Lymphs Abs: 1.3 10*3/uL (ref 0.7–4.0)
MCH: 31.9 pg (ref 26.0–34.0)
MCHC: 33.1 g/dL (ref 30.0–36.0)
MCV: 96.4 fL (ref 78.0–100.0)
Monocytes Absolute: 0.4 10*3/uL (ref 0.1–1.0)
Monocytes Relative: 7 %
NEUTROS ABS: 4.2 10*3/uL (ref 1.7–7.7)
Neutrophils Relative %: 70 %
PLATELETS: 158 10*3/uL (ref 150–400)
RBC: 3.86 MIL/uL — AB (ref 3.87–5.11)
RDW: 12.4 % (ref 11.5–15.5)
WBC: 5.9 10*3/uL (ref 4.0–10.5)

## 2015-12-31 LAB — COMPREHENSIVE METABOLIC PANEL
ALT: 18 U/L (ref 14–54)
AST: 29 U/L (ref 15–41)
Albumin: 4.1 g/dL (ref 3.5–5.0)
Alkaline Phosphatase: 65 U/L (ref 38–126)
Anion gap: 7 (ref 5–15)
BUN: 19 mg/dL (ref 6–20)
CALCIUM: 9.1 mg/dL (ref 8.9–10.3)
CHLORIDE: 111 mmol/L (ref 101–111)
CO2: 26 mmol/L (ref 22–32)
Creatinine, Ser: 1.17 mg/dL — ABNORMAL HIGH (ref 0.44–1.00)
GFR calc Af Amer: 51 mL/min — ABNORMAL LOW (ref >60)
GFR, EST NON AFRICAN AMERICAN: 44 mL/min — AB (ref >60)
Glucose, Bld: 86 mg/dL (ref 65–99)
Potassium: 4.2 mmol/L (ref 3.5–5.1)
Sodium: 144 mmol/L (ref 135–145)
TOTAL PROTEIN: 6.4 g/dL — AB (ref 6.5–8.1)
Total Bilirubin: 0.8 mg/dL (ref 0.3–1.2)

## 2015-12-31 LAB — URINALYSIS, ROUTINE W REFLEX MICROSCOPIC
Bilirubin Urine: NEGATIVE
Glucose, UA: NEGATIVE mg/dL
Hgb urine dipstick: NEGATIVE
KETONES UR: NEGATIVE mg/dL
LEUKOCYTES UA: NEGATIVE
NITRITE: NEGATIVE
Protein, ur: NEGATIVE mg/dL
Specific Gravity, Urine: 1.014 (ref 1.005–1.030)
pH: 6.5 (ref 5.0–8.0)

## 2015-12-31 LAB — TROPONIN I: Troponin I: <0.03 ng/mL (ref <0.031)

## 2015-12-31 MED ORDER — SODIUM CHLORIDE 0.9 % IV BOLUS (SEPSIS)
1000.0000 mL | Freq: Once | INTRAVENOUS | Status: AC
  Administered 2015-12-31: 1000 mL via INTRAVENOUS

## 2015-12-31 MED ORDER — SODIUM CHLORIDE 0.9 % IV BOLUS (SEPSIS)
500.0000 mL | Freq: Once | INTRAVENOUS | Status: AC
  Administered 2015-12-31: 500 mL via INTRAVENOUS

## 2015-12-31 NOTE — ED Notes (Signed)
Bed: EA54 Expected date: 12/31/15 Expected time:  Means of arrival: Ambulance Comments: EMS

## 2015-12-31 NOTE — ED Notes (Signed)
Per EMS- Patient was walking in her house and became lightheaded with slight nausea. EMS arrived. BP-70/40 sitting. NS 500 ml IV given BP 108/60.

## 2015-12-31 NOTE — Discharge Instructions (Signed)
Please read and follow all provided instructions.  Your diagnoses today include:  1. Hypotension, unspecified hypotension type    Tests performed today include:  Blood counts and electrolytes  Blood tests to check liver and kidney function  Blood tests to check pancreas function  Urine test to look for infection and pregnancy (in women)  Vital signs. See below for your results today.   Medications prescribed:   Take any prescribed medications only as directed.  Home care instructions:   Follow any educational materials contained in this packet.  Follow-up instructions: Please follow-up with your primary care provider in the next 2 days for further evaluation of your symptoms.    Return instructions:  SEEK IMMEDIATE MEDICAL ATTENTION IF:  The pain does not go away or becomes severe   A temperature above 101F develops   Repeated vomiting occurs (multiple episodes)   The pain becomes localized to portions of the abdomen. The right side could possibly be appendicitis. In an adult, the left lower portion of the abdomen could be colitis or diverticulitis.   Blood is being passed in stools or vomit (bright red or black tarry stools)   You develop chest pain, difficulty breathing, dizziness or fainting, or become confused, poorly responsive, or inconsolable (young children)  If you have any other emergent concerns regarding your health  Additional Information: Abdominal (belly) pain can be caused by many things. Your caregiver performed an examination and possibly ordered blood/urine tests and imaging (CT scan, x-rays, ultrasound). Many cases can be observed and treated at home after initial evaluation in the emergency department. Even though you are being discharged home, abdominal pain can be unpredictable. Therefore, you need a repeated exam if your pain does not resolve, returns, or worsens. Most patients with abdominal pain don't have to be admitted to the hospital or have  surgery, but serious problems like appendicitis and gallbladder attacks can start out as nonspecific pain. Many abdominal conditions cannot be diagnosed in one visit, so follow-up evaluations are very important.  Your vital signs today were: BP 109/64 mmHg   Pulse 54   Temp(Src) 98 F (36.7 C) (Oral)   Resp 18   SpO2 99% If your blood pressure (bp) was elevated above 135/85 this visit, please have this repeated by your doctor within one month. --------------

## 2015-12-31 NOTE — ED Provider Notes (Signed)
CSN: [id number]     Arrival date & time 12/31/15  1351 History   First MD Initiated Contact with Patient 12/31/15 1418     Chief Complaint  Patient presents with  . Hypotension   (Consider location/radiation/quality/duration/timing/severity/associated sxs/prior Treatment) HPI 75 y.o. female with a hx of Dementia, HLD, presents to the Emergency Department today due to low blood pressure. According to her son, they were sitting down at breakfast this morning and the patient got pale and "was not feeling well." They decided to stand her up to take her to the couch when her knees gave out. There was no trauma/fall/LOC. They decided to sit her back down and call EMS. En route, EMS noticed her BP was 70/40. They gave her NS 500L Bolus and brought the BP to 108/60. Currently the patient is feeling well. No CP/SOB/ABD Pain. No headache. No fevers. No N/V/D. Otherwise asymptomatic.   Past Medical History  Diagnosis Date  . Arthritis   . Depression   . Glaucoma   . Allergy   . Thyroid disease   . Migraines   . Hyperlipidemia   . Osteopenia   . Dementia    Past Surgical History  Procedure Laterality Date  . Colon surgery  1995    cancer  . Breast surgery     Family History  Problem Relation Age of Onset  . Alcohol abuse Father   . Cancer Paternal Grandmother     colon   Social History  Substance Use Topics  . Smoking status: Never Smoker   . Smokeless tobacco: Never Used  . Alcohol Use: No   OB History    No data available     Review of Systems ROS reviewed and all are negative for acute change except as noted in the HPI.  Allergies  Aricept  Home Medications   Prior to Admission medications   Medication Sig Start Date End Date Taking? Authorizing Provider  ARIPiprazole (ABILIFY) 2 MG tablet Take 2 tablets by mouth at bedtime. 12/06/15   Kristian Covey, MD  Cetirizine-Pseudoephedrine (ZYRTEC-D PO) Take by mouth.    Historical Provider, MD  citalopram (CELEXA) 20 MG  tablet Take 1 tablet (20 mg total) by mouth daily. 11/14/15   Kristian Covey, MD  levothyroxine (SYNTHROID, LEVOTHROID) 88 MCG tablet TAKE 1 TABLET BY MOUTH DAILY 12/28/15   Kristian Covey, MD  memantine (NAMENDA) 10 MG tablet TAKE 1 TABLET BY MOUTH TWICE DAILY 12/12/15   Kristian Covey, MD  metoprolol tartrate (LOPRESSOR) 25 MG tablet TAKE 1/2 TABLET BY MOUTH TWICE DAILY 11/10/15   Kristian Covey, MD  naproxen sodium (ANAPROX) 220 MG tablet Take 220 mg by mouth as needed.    Historical Provider, MD  simvastatin (ZOCOR) 20 MG tablet TAKE 1 TABLET BY MOUTH EVERY NIGHT AT BEDTIME 11/10/15   Kristian Covey, MD  TRAVATAN Z 0.004 % ophthalmic solution Place 1 drop into both eyes at bedtime.  04/18/11   Historical Provider, MD  Vitamin D, Ergocalciferol, (DRISDOL) 50000 UNITS CAPS capsule TAKE 1 CAPSULE BY MOUTH EVERY WEEK 03/02/15   Kristian Covey, MD   BP 109/64 mmHg  Pulse 54  Temp(Src) 98 F (36.7 C) (Oral)  Resp 18  SpO2 99%   Physical Exam  Constitutional: She is oriented to person, place, and time. She appears well-developed and well-nourished.  HENT:  Head: Normocephalic and atraumatic.  Eyes: EOM are normal. Pupils are equal, round, and reactive to light.  Neck: Normal range  of motion. Neck supple.  Cardiovascular: Normal rate, regular rhythm and normal heart sounds.   Pulmonary/Chest: Effort normal and breath sounds normal.  Abdominal: Soft. Bowel sounds are normal. There is no tenderness. There is no rigidity, no rebound, no guarding, no tenderness at McBurney's point and negative Murphy's sign.  Musculoskeletal: Normal range of motion.  Neurological: She is alert and oriented to person, place, and time. She has normal strength. No cranial nerve deficit or sensory deficit.  Skin: Skin is warm and dry.  Psychiatric: She has a normal mood and affect. Her behavior is normal. Thought content normal.  Nursing note and vitals reviewed.  ED Course  Procedures (including  critical care time) Labs Review Labs Reviewed  CBC WITH DIFFERENTIAL/PLATELET - Abnormal; Notable for the following:    RBC 3.86 (*)    All other components within normal limits  COMPREHENSIVE METABOLIC PANEL - Abnormal; Notable for the following:    Creatinine, Ser 1.17 (*)    Total Protein 6.4 (*)    GFR calc non Af Amer 44 (*)    GFR calc Af Amer 51 (*)    All other components within normal limits  URINALYSIS, ROUTINE W REFLEX MICROSCOPIC (NOT AT [facility])  TROPONIN I   Imaging Review Dg Chest 2 View  12/31/2015  CLINICAL DATA:  Nausea, dizziness, lightheaded, hypotension EXAM: CHEST  2 VIEW COMPARISON:  05/11/2005 FINDINGS: Mild hyperinflation and upper lobe parenchymal scarring. No focal pneumonia, collapse or consolidation. No edema, effusion, or pneumothorax. Trachea midline. Normal heart size and vascularity. No acute osseous finding. IMPRESSION: No active cardiopulmonary disease. Electronically Signed   By: Judie Petit.  Shick M.D.   On: 12/31/2015 14:52   I have personally reviewed and evaluated these images and lab results as part of my medical decision-making.   EKG Interpretation   Date/Time:  Saturday December 31 2015 14:22:07 EST Ventricular Rate:  59 PR Interval:  144 QRS Duration: 91 QT Interval:  451 QTC Calculation: 447 R Axis:   52 Text Interpretation:  Sinus rhythm No significant change since last  tracing Confirmed by FLOYD MD, Reuel Boom ([id number]) on 12/31/2015 2:43:19 PM      MDM  I have reviewed relevant laboratory values. I have reviewed relevant imaging studies. I personally interpreted the relevant EKG. I have reviewed the relevant previous healthcare records. I have reviewed EMS Documentation. I obtained HPI from historian. Patient discussed with supervising physician  ED Course:  Assessment: 74y F with Hx Dementia presents with hypotension.No hx of syncope or heart abnormalities (arrhthmias, CHF, etc.) HPI sounds Orthostatic. So far Labs unremarkable. EKG NSR. CXR  Neg. If UTI present, will treat accordingly. Will DC patient with follow up to PCP for further management of symptoms. Patient is in no acute distress. Vital Signs are stable. Patient able to tolerate PO. Pt acknowledges and agrees with plan  Disposition/Plan:  4:28 PM: Sign out to: Ailene Rud, PA-C   Supervising Physician Melene Plan, DO   Final diagnoses:  Hypotension, unspecified hypotension type       Audry Pili, PA-C 12/31/15 1631  Melene Plan, DO 01/01/16 [phone]

## 2015-12-31 NOTE — ED Provider Notes (Signed)
Patient care received from Shary Decamp, PA-C at shift change. For full HPI, see note by provider.   In short, 75 year old female with PMHx of dementia presenting with hypotension. Pt was eating breakfast with her son and she stood up to walk to the couch and her knees gave out. She did not fall or strike her head. EMS was called and noted her BP was 70/40. She was given 500 cc bolus and BP was 109/64 at presentation to ED. Pt has been asymptomatic while in ED. CBC, CMP, troponin, DG chest and EKG unremarkable. Pending urinalysis before discharge. If UA positive, will treat accordingly. If UA negative, will discharge home with close PCP follow up.   4:30 - Patients UA negative. Pt has not been hypotensive while in ED. She is tolerating PO before discharge and remains asymptomatic on re-evaluation. Patient will be discharged per PA Mohr's instruction. She is to schedule a follow up appointment with her PCP for Monday. Return precautions given in discharge paperwork and discussed with pt at bedside. Pt stable for discharge  Filed Vitals:   12/31/15 1357 12/31/15 1405  BP:  109/64  Pulse:  54  Temp:  98 F (36.7 C)  TempSrc:  Oral  Resp:  18  SpO2: 98% 99%     Recent Results (from the past 2160 hour(s))  CBC with Differential     Status: Abnormal   Collection Time: 12/31/15  2:53 PM  Result Value Ref Range   WBC 5.9 4.0 - 10.5 K/uL   RBC 3.86 (L) 3.87 - 5.11 MIL/uL   Hemoglobin 12.3 12.0 - 15.0 g/dL   HCT 37.2 36.0 - 46.0 %   MCV 96.4 78.0 - 100.0 fL   MCH 31.9 26.0 - 34.0 pg   MCHC 33.1 30.0 - 36.0 g/dL   RDW 12.4 11.5 - 15.5 %   Platelets 158 150 - 400 K/uL   Neutrophils Relative % 70 %   Neutro Abs 4.2 1.7 - 7.7 K/uL   Lymphocytes Relative 21 %   Lymphs Abs 1.3 0.7 - 4.0 K/uL   Monocytes Relative 7 %   Monocytes Absolute 0.4 0.1 - 1.0 K/uL   Eosinophils Relative 1 %   Eosinophils Absolute 0.1 0.0 - 0.7 K/uL   Basophils Relative 1 %   Basophils Absolute 0.0 0.0 - 0.1 K/uL   Comprehensive metabolic panel     Status: Abnormal   Collection Time: 12/31/15  2:53 PM  Result Value Ref Range   Sodium 144 135 - 145 mmol/L   Potassium 4.2 3.5 - 5.1 mmol/L   Chloride 111 101 - 111 mmol/L   CO2 26 22 - 32 mmol/L   Glucose, Bld 86 65 - 99 mg/dL   BUN 19 6 - 20 mg/dL   Creatinine, Ser 1.17 (H) 0.44 - 1.00 mg/dL   Calcium 9.1 8.9 - 10.3 mg/dL   Total Protein 6.4 (L) 6.5 - 8.1 g/dL   Albumin 4.1 3.5 - 5.0 g/dL   AST 29 15 - 41 U/L   ALT 18 14 - 54 U/L   Alkaline Phosphatase 65 38 - 126 U/L   Total Bilirubin 0.8 0.3 - 1.2 mg/dL   GFR calc non Af Amer 44 (L) >60 mL/min   GFR calc Af Amer 51 (L) >60 mL/min    Comment: (NOTE) The eGFR has been calculated using the CKD EPI equation. This calculation has not been validated in all clinical situations. eGFR's persistently <60 mL/min signify possible Chronic Kidney Disease.  Anion gap 7 5 - 15  Troponin I     Status: None   Collection Time: 12/31/15  2:53 PM  Result Value Ref Range   Troponin I <0.03 <0.031 ng/mL    Comment:        NO INDICATION OF MYOCARDIAL INJURY.   Urinalysis, Routine w reflex microscopic (not at [facility])     Status: Abnormal   Collection Time: 12/31/15  4:06 PM  Result Value Ref Range   Color, Urine YELLOW YELLOW   APPearance HAZY (A) CLEAR   Specific Gravity, Urine 1.014 1.005 - 1.030   pH 6.5 5.0 - 8.0   Glucose, UA NEGATIVE NEGATIVE mg/dL   Hgb urine dipstick NEGATIVE NEGATIVE   Bilirubin Urine NEGATIVE NEGATIVE   Ketones, ur NEGATIVE NEGATIVE mg/dL   Protein, ur NEGATIVE NEGATIVE mg/dL   Nitrite NEGATIVE NEGATIVE   Leukocytes, UA NEGATIVE NEGATIVE    Comment: MICROSCOPIC NOT DONE ON URINES WITH NEGATIVE PROTEIN, BLOOD, LEUKOCYTES, NITRITE, OR GLUCOSE <1000 mg/dL.        Josephina Gip, PA-C 12/31/15 [place], DO 01/03/16 1759

## 2016-01-02 ENCOUNTER — Telehealth: Payer: Self-pay | Admitting: Family Medicine

## 2016-01-02 NOTE — Telephone Encounter (Addendum)
Pt's caregiver states pt's insurance company still denied her ARIPiprazole (ABILIFY) 2 MG tablet even after dr Burchette's letter. Son and husband states the insurance company has refused to speak with them and advised to call her pcp. Is there another rx? Can call caregiver on her cell.  [facility] Please advise.

## 2016-01-04 ENCOUNTER — Other Ambulatory Visit: Payer: Self-pay | Admitting: Family Medicine

## 2016-01-04 ENCOUNTER — Telehealth: Payer: Self-pay | Admitting: Family Medicine

## 2016-01-04 NOTE — Telephone Encounter (Signed)
Would see how she does off the Abilify first.

## 2016-01-04 NOTE — Telephone Encounter (Signed)
Spoke with Caregiver and she is aware to stop medication and to report any concerning symptoms over the next few weeks.

## 2016-01-04 NOTE — Telephone Encounter (Signed)
Left message to call back  

## 2016-01-04 NOTE — Telephone Encounter (Signed)
I have educated this Home [facility] Company from previous annotation that they are to monitor her SX and if she is not adjusting to this change to let us know and we will send in Lorazepam. Please advise on this. They are wanting the Lorazepam asap bc they feel she will have a with draw affect??

## 2016-01-04 NOTE — Telephone Encounter (Signed)
There is really not an "equivalent" medication to Abilify.  I would suggest just leaving this off if they are unable to get filled.  If she starts to get agitated at night we could use low dose medication such as Lorazepam- but would rather avoid to reduce risk of falls.

## 2016-01-04 NOTE — Telephone Encounter (Signed)
Sophia Wright from [facility] call to say that she is  concern about stopping pt Abilify. She would like a call back.  [phone]    [phone]

## 2016-01-05 NOTE — Telephone Encounter (Signed)
Sophia Wright is aware that dr burchette wants pt to be monitored being off medication. They will keep [place] posted.

## 2016-01-10 ENCOUNTER — Telehealth: Payer: Self-pay | Admitting: Family Medicine

## 2016-01-10 NOTE — Telephone Encounter (Signed)
Is this something we can do?

## 2016-01-10 NOTE — Telephone Encounter (Signed)
I can do this letter on the 13th unless they need more urgently.

## 2016-01-10 NOTE — Telephone Encounter (Addendum)
Pt husband would like a letter stating due to his wife dementia she is unable to handle her affairs . Pt husband has health care POA. Pt has an appt on 01-16-16

## 2016-01-11 NOTE — Telephone Encounter (Signed)
Pt is agreeable to pick up letter on 2/13

## 2016-01-11 NOTE — Telephone Encounter (Signed)
Left message for pt to call back  °

## 2016-01-16 ENCOUNTER — Ambulatory Visit (INDEPENDENT_AMBULATORY_CARE_PROVIDER_SITE_OTHER): Payer: Medicare Other | Admitting: Family Medicine

## 2016-01-16 ENCOUNTER — Encounter: Payer: Self-pay | Admitting: Family Medicine

## 2016-01-16 VITALS — BP 120/70 | HR 68 | Temp 97.6°F | Ht 72.0 in | Wt 184.4 lb

## 2016-01-16 DIAGNOSIS — F0391 Unspecified dementia with behavioral disturbance: Secondary | ICD-10-CM | POA: Diagnosis not present

## 2016-01-16 DIAGNOSIS — R55 Syncope and collapse: Secondary | ICD-10-CM | POA: Diagnosis not present

## 2016-01-16 NOTE — Progress Notes (Signed)
   Subjective:    Patient ID: Sophia Wright, female    DOB: [birth year], 75 y.o.   MRN: [id number]  HPI Patient has advanced dementia. She is seen for follow-up today.  Recent ER visit for near-syncope on January 28. Family preparing to eat a meal and they noticed that she looked fairly pale and complained of feeling dizzy. No actual syncope. She denied chest pain. Taken the ER for further evaluation. Suspected dehydration. Lab work reviewed from ED and all unremarkable. Chest x-ray unremarkable. Patient improved with IV fluids. No recurrent symptoms since discharge.  Advanced also her dementia. Takes Namenda. Also takes citalopram. She takes low-dose Abilify 2 mg at night. Recently had issues with insurance coverage with Abilify and had increased agitation when she came off this. They have managed to find a local pharmacy that they can afford getting the Abilify and she has done much better since starting back on that. No nighttime agitation. She has 24-hour coverage for care  Past Medical History  Diagnosis Date  . Arthritis   . Depression   . Glaucoma   . Allergy   . Thyroid disease   . Migraines   . Hyperlipidemia   . Osteopenia   . Dementia    Past Surgical History  Procedure Laterality Date  . Colon surgery  1995    cancer  . Breast surgery      reports that she has never smoked. She has never used smokeless tobacco. She reports that she does not drink alcohol or use illicit drugs. family history includes Alcohol abuse in her father; Cancer in her paternal grandmother. Allergies  Allergen Reactions  . Aricept [Donepezil Hcl] Itching      Review of Systems Unable to obtain reliable review of systems from patient secondary to advanced dementia    Objective:   Physical Exam  Constitutional: She appears well-developed and well-nourished.  Eyes: Pupils are equal, round, and reactive to light.  Neck: Neck supple. No JVD present. No thyromegaly present.    Cardiovascular: Normal rate and regular rhythm.  Exam reveals no gallop.   Pulmonary/Chest: Effort normal and breath sounds normal. No respiratory distress. She has no wheezes. She has no rales.  Musculoskeletal: She exhibits no edema.  Neurological: She is alert.          Assessment & Plan:  #1 recent near-syncope. Probably related to dehydration. Unremarkable labs and unremarkable exam at this time. No recurrent symptoms. Stay well-hydrated  #2 advanced dementia. Continue Namenda. Continue citalopram. Continue low-dose Abilify at night. She has excellent family support and also sitter for several hours per week

## 2016-01-30 ENCOUNTER — Other Ambulatory Visit: Payer: Self-pay | Admitting: Family Medicine

## 2016-02-01 ENCOUNTER — Other Ambulatory Visit: Payer: Self-pay | Admitting: Family Medicine

## 2016-02-06 ENCOUNTER — Ambulatory Visit
Admission: RE | Admit: 2016-02-06 | Discharge: 2016-02-06 | Disposition: A | Discharge status: Home / Self Care | Payer: Medicare Other | Source: Ambulatory Visit | Attending: Family Medicine | Admitting: Family Medicine

## 2016-02-06 DIAGNOSIS — Z1231 Encounter for screening mammogram for malignant neoplasm of breast: Secondary | ICD-10-CM

## 2016-02-06 DIAGNOSIS — M8580 Other specified disorders of bone density and structure, unspecified site: Secondary | ICD-10-CM

## 2016-03-12 ENCOUNTER — Other Ambulatory Visit: Payer: Self-pay | Admitting: Family Medicine

## 2016-04-23 ENCOUNTER — Other Ambulatory Visit: Payer: Self-pay | Admitting: Family Medicine

## 2016-05-16 ENCOUNTER — Ambulatory Visit: Payer: Medicare Other | Admitting: Family Medicine

## 2016-05-23 LAB — HM DIABETES EYE EXAM

## 2016-05-25 ENCOUNTER — Encounter: Payer: Self-pay | Admitting: Family Medicine

## 2016-05-29 ENCOUNTER — Other Ambulatory Visit: Payer: Self-pay | Admitting: Family Medicine

## 2016-05-29 MED ORDER — ARIPIPRAZOLE 2 MG PO TABS: 2.0000 mg | ORAL_TABLET | Freq: Two times a day (BID) | ORAL | Status: AC

## 2016-05-29 NOTE — Telephone Encounter (Signed)
Refill for 6 months. 

## 2016-05-29 NOTE — Telephone Encounter (Signed)
Request sent from [facility] to refill medication. I see this is in Historical and you have not prescribed this medication for the patient. Patient's last OV was 01/16/16.

## 2016-06-11 ENCOUNTER — Ambulatory Visit (INDEPENDENT_AMBULATORY_CARE_PROVIDER_SITE_OTHER): Payer: Medicare Other | Admitting: Family Medicine

## 2016-06-11 VITALS — BP 100/80 | HR 70 | Ht 72.0 in | Wt 183.0 lb

## 2016-06-11 DIAGNOSIS — F0391 Unspecified dementia with behavioral disturbance: Secondary | ICD-10-CM | POA: Diagnosis not present

## 2016-06-11 DIAGNOSIS — E785 Hyperlipidemia, unspecified: Secondary | ICD-10-CM | POA: Diagnosis not present

## 2016-06-11 DIAGNOSIS — E039 Hypothyroidism, unspecified: Secondary | ICD-10-CM

## 2016-06-11 LAB — TSH: TSH: 3.8 u[IU]/mL (ref 0.35–4.50)

## 2016-06-11 LAB — BASIC METABOLIC PANEL
BUN: 19 mg/dL (ref 6–23)
CHLORIDE: 106 meq/L (ref 96–112)
CO2: 30 mEq/L (ref 19–32)
Calcium: 9.7 mg/dL (ref 8.4–10.5)
Creatinine, Ser: 1.18 mg/dL (ref 0.40–1.20)
GFR: 47.32 mL/min — ABNORMAL LOW (ref >60.00)
GLUCOSE: 86 mg/dL (ref 70–99)
POTASSIUM: 4.6 meq/L (ref 3.5–5.1)
SODIUM: 141 meq/L (ref 135–145)

## 2016-06-11 LAB — HEPATIC FUNCTION PANEL
ALBUMIN: 4.4 g/dL (ref 3.5–5.2)
ALT: 15 U/L (ref 0–35)
AST: 22 U/L (ref 0–37)
Alkaline Phosphatase: 80 U/L (ref 39–117)
Bilirubin, Direct: 0.1 mg/dL (ref 0.0–0.3)
TOTAL PROTEIN: 6.7 g/dL (ref 6.0–8.3)
Total Bilirubin: 0.6 mg/dL (ref 0.2–1.2)

## 2016-06-11 LAB — LIPID PANEL
CHOL/HDL RATIO: 3
Cholesterol: 139 mg/dL (ref 0–200)
HDL: 49.6 mg/dL (ref >39.00)
LDL CALC: 55 mg/dL (ref 0–99)
NONHDL: 89.21
TRIGLYCERIDES: 172 mg/dL — AB (ref 0.0–149.0)
VLDL: 34.4 mg/dL (ref 0.0–40.0)

## 2016-06-11 NOTE — Progress Notes (Signed)
   Subjective:    Patient ID: Sophia Wright, female    DOB: 07/14/1941, 75 y.o.   MRN: [id number]  HPI Patient seen for medical follow-up. She has history of advanced dementia, osteoarthritis, hypothyroidism, history of depression, glaucoma. Her husband apparently died fairly suddenly recently. Patient has 24-hour care at this time. She has 2 sons. One of them has power of attorney.  Caregiver today states no acute problems. She walks at least once per day with the caregiver for exercise. They try to keep her engaged in activities as much as possible. She has had (as expected) some difficulties adjusting since her husband passed away. She did not bring this up today.  Medications reviewed. Compliant with all. She is due for lab work. She has been fairly stable on her feet. No recent reported falls Appetite and weight are stable  Past Medical History  Diagnosis Date  . Arthritis   . Depression   . Glaucoma   . Allergy   . Thyroid disease   . Migraines   . Hyperlipidemia   . Osteopenia   . Dementia    Past Surgical History  Procedure Laterality Date  . Colon surgery  1995    cancer  . Breast surgery      reports that she has never smoked. She has never used smokeless tobacco. She reports that she does not drink alcohol or use illicit drugs. family history includes Alcohol abuse in her father; Cancer in her paternal grandmother. Allergies  Allergen Reactions  . Aricept [Donepezil Hcl] Itching      Review of Systems History not reliable from patient as she has advanced dementia. Caregiver voices no specific complaints. No recent falls. No chest pains. No fevers or chills. No dysuria. No cough.    Objective:   Physical Exam  Constitutional: She appears well-developed and well-nourished.  Neck: Neck supple. No thyromegaly present.  Cardiovascular: Normal rate and regular rhythm.   Pulmonary/Chest: Effort normal and breath sounds normal. No respiratory distress. She has no  wheezes. She has no rales.  Musculoskeletal: She exhibits no edema.  Neurological: She is alert.  Psychiatric: She has a normal mood and affect. Her behavior is normal.          Assessment & Plan:  #1 advanced dementia. Patient requiring 24-hour care. Continue current medications. She is currently doing well with combination of citalopram and low-dose Abilify. She had agitation prior to starting those.  #2 hypothyroidism. Recheck TSH.  #3 hyperlipidemia. Recheck lipid and hepatic panel.  Kristian CoveyBruce W Tokiko Diefenderfer MD [facility]

## 2016-06-11 NOTE — Progress Notes (Signed)
Pre visit review using our clinic review tool, if applicable. No additional management support is needed unless otherwise documented below in the visit note. 

## 2016-06-12 ENCOUNTER — Ambulatory Visit: Payer: Medicare Other | Admitting: Family Medicine

## 2016-07-02 ENCOUNTER — Other Ambulatory Visit: Payer: Self-pay | Admitting: Family Medicine

## 2016-08-02 ENCOUNTER — Other Ambulatory Visit: Payer: Self-pay | Admitting: Family Medicine

## 2016-08-26 ENCOUNTER — Encounter: Payer: Self-pay | Admitting: Family Medicine

## 2016-09-03 ENCOUNTER — Ambulatory Visit (INDEPENDENT_AMBULATORY_CARE_PROVIDER_SITE_OTHER): Payer: Medicare Other | Admitting: *Deleted

## 2016-09-03 DIAGNOSIS — Z23 Encounter for immunization: Secondary | ICD-10-CM

## 2016-09-03 MED ORDER — TUBERCULIN PPD 5 UNIT/0.1ML ID SOLN: 5.0000 [IU] | Freq: Once | INTRADERMAL | Status: DC

## 2016-09-05 LAB — TB SKIN TEST
Induration: 0 mm
TB Skin Test: NEGATIVE

## 2016-09-13 ENCOUNTER — Telehealth: Payer: Self-pay | Admitting: Family Medicine

## 2016-09-13 NOTE — Telephone Encounter (Signed)
No, I did not read the patient's PPD results. I'd advise to speak with patient on details or review most recent reading in chart for specifics.

## 2016-09-13 NOTE — Telephone Encounter (Signed)
I have printed this for Dr. Caryl NeverBurchette to sign--do you remember if you read this? I didn't see anything under comments.

## 2016-09-13 NOTE — Telephone Encounter (Signed)
Need a copy of the most recent PPD and a signature on the activity form to be faxed to [phone] by noon tomorrow.

## 2016-09-14 NOTE — Telephone Encounter (Signed)
I do remember reading ppd and up dated chart. Information faxed.

## 2016-09-14 NOTE — Telephone Encounter (Signed)
Faxed to facility.

## 2016-09-14 NOTE — Telephone Encounter (Signed)
Autumn please fax to [phone] not phone #

## 2017-05-03 IMAGING — CR DG CHEST 2V
2 series · 2 of 2 positions shown · non-contrast
Comparison: 05/11/2005

CLINICAL DATA: Nausea, dizziness, lightheaded, hypotension

EXAM:
CHEST  2 VIEW

[w chest lat]
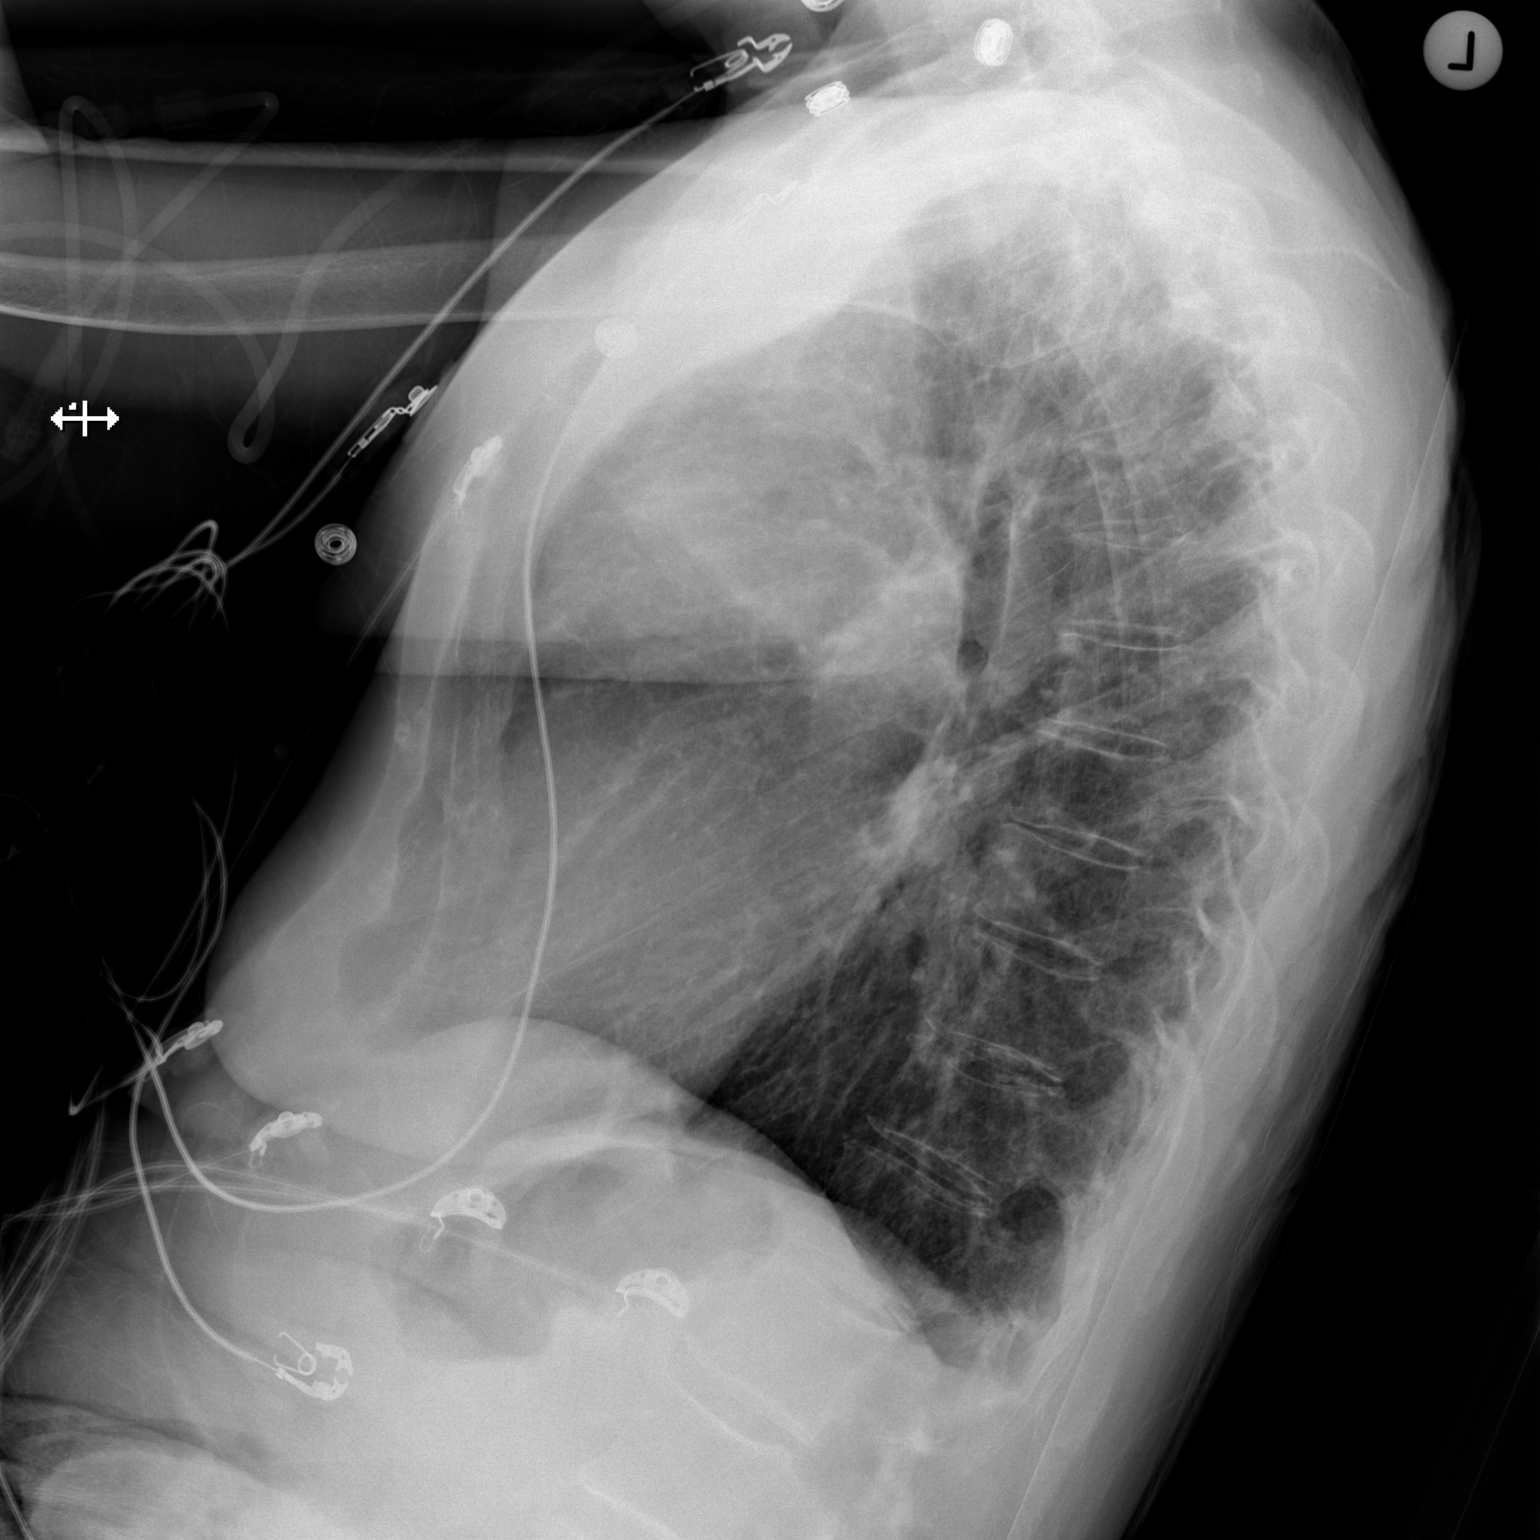

[x chest ap]
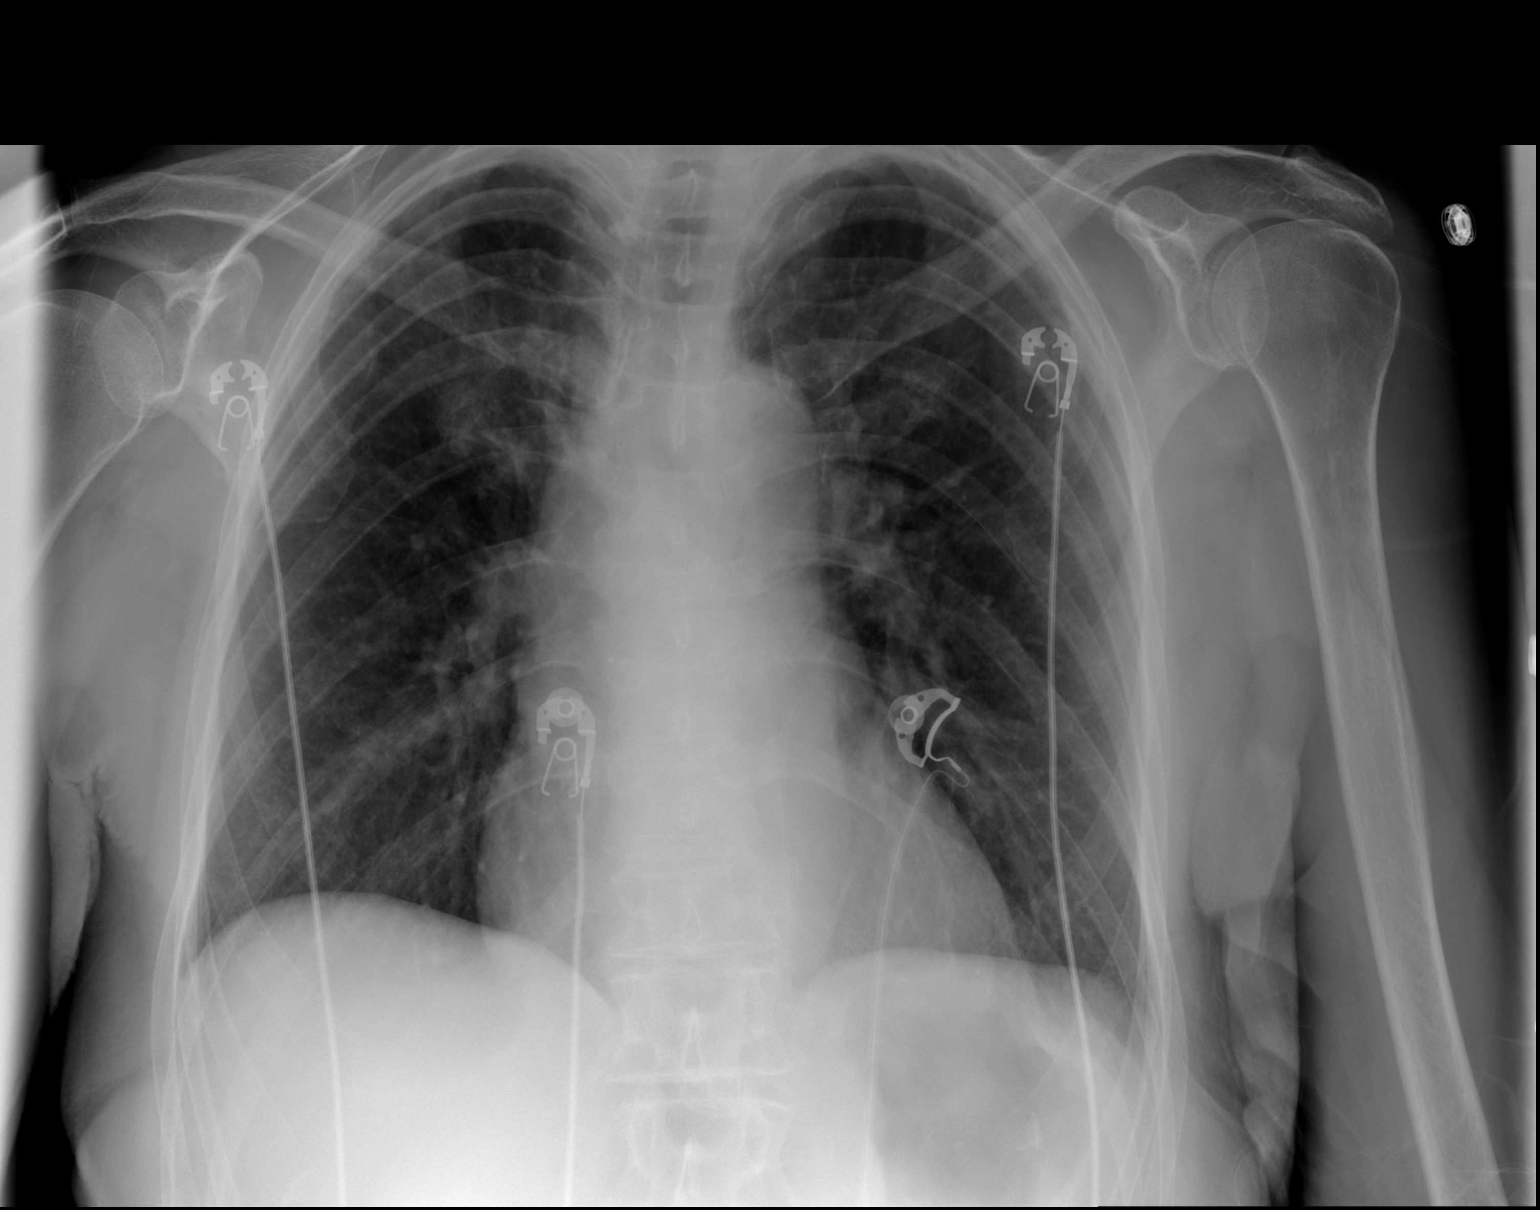

[2 of 2 positions shown; findings below may reference images not displayed]

FINDINGS: Mild hyperinflation and upper lobe parenchymal scarring. No focal
pneumonia, collapse or consolidation. No edema, effusion, or
pneumothorax. Trachea midline. Normal heart size and vascularity. No
acute osseous finding.
IMPRESSION: No active cardiopulmonary disease.

## 2017-08-03 DEATH — deceased

## 2017-09-02 DEATH — deceased
